# Patient Record
Sex: Male | Born: 1945
Health system: Southern US, Community
[De-identification: ages and names within clinical notes are randomized; demographics above are authoritative.]

## PROBLEM LIST (undated history)

## (undated) DIAGNOSIS — I493 Ventricular premature depolarization: Secondary | ICD-10-CM

## (undated) DIAGNOSIS — E785 Hyperlipidemia, unspecified: Secondary | ICD-10-CM

## (undated) DIAGNOSIS — D7589 Other specified diseases of blood and blood-forming organs: Secondary | ICD-10-CM

## (undated) DIAGNOSIS — Z8709 Personal history of other diseases of the respiratory system: Secondary | ICD-10-CM

## (undated) DIAGNOSIS — D696 Thrombocytopenia, unspecified: Secondary | ICD-10-CM

## (undated) DIAGNOSIS — N400 Enlarged prostate without lower urinary tract symptoms: Secondary | ICD-10-CM

## (undated) DIAGNOSIS — G43909 Migraine, unspecified, not intractable, without status migrainosus: Secondary | ICD-10-CM

## (undated) DIAGNOSIS — Z86010 Personal history of colonic polyps: Secondary | ICD-10-CM

## (undated) DIAGNOSIS — I10 Essential (primary) hypertension: Secondary | ICD-10-CM

## (undated) DIAGNOSIS — C189 Malignant neoplasm of colon, unspecified: Secondary | ICD-10-CM

## (undated) HISTORY — PX: TONSILECTOMY, ADENOIDECTOMY, BILATERAL MYRINGOTOMY AND TUBES: SHX2538

## (undated) HISTORY — DX: Thrombocytopenia, unspecified: D69.6

## (undated) HISTORY — DX: Essential (primary) hypertension: I10

## (undated) HISTORY — DX: Benign prostatic hyperplasia without lower urinary tract symptoms: N40.0

## (undated) HISTORY — PX: CATARACT EXTRACTION: SUR2

## (undated) HISTORY — DX: Malignant neoplasm of colon, unspecified: C18.9

## (undated) HISTORY — DX: Other specified diseases of blood and blood-forming organs: D75.89

## (undated) HISTORY — PX: NASAL SEPTUM SURGERY: SHX37

## (undated) HISTORY — DX: Hyperlipidemia, unspecified: E78.5

---

## 1999-09-13 ENCOUNTER — Ambulatory Visit (HOSPITAL_BASED_OUTPATIENT_CLINIC_OR_DEPARTMENT_OTHER): Admission: RE | Admit: 1999-09-13 | Discharge: 1999-09-13 | Payer: Self-pay | Admitting: *Deleted

## 2001-01-07 ENCOUNTER — Ambulatory Visit (HOSPITAL_BASED_OUTPATIENT_CLINIC_OR_DEPARTMENT_OTHER): Admission: RE | Admit: 2001-01-07 | Discharge: 2001-01-07 | Payer: Self-pay | Admitting: Surgery

## 2002-04-29 DIAGNOSIS — C189 Malignant neoplasm of colon, unspecified: Secondary | ICD-10-CM

## 2002-04-29 HISTORY — PX: COLECTOMY: SHX59

## 2002-04-29 HISTORY — DX: Malignant neoplasm of colon, unspecified: C18.9

## 2002-07-02 ENCOUNTER — Encounter (INDEPENDENT_AMBULATORY_CARE_PROVIDER_SITE_OTHER): Payer: Self-pay | Admitting: Specialist

## 2002-07-02 ENCOUNTER — Ambulatory Visit (HOSPITAL_COMMUNITY): Admission: RE | Admit: 2002-07-02 | Discharge: 2002-07-02 | Payer: Self-pay | Admitting: *Deleted

## 2002-07-12 ENCOUNTER — Encounter (INDEPENDENT_AMBULATORY_CARE_PROVIDER_SITE_OTHER): Payer: Self-pay | Admitting: *Deleted

## 2002-07-12 ENCOUNTER — Ambulatory Visit (HOSPITAL_COMMUNITY): Admission: RE | Admit: 2002-07-12 | Discharge: 2002-07-12 | Payer: Self-pay | Admitting: *Deleted

## 2002-07-27 ENCOUNTER — Encounter: Payer: Self-pay | Admitting: General Surgery

## 2002-07-27 ENCOUNTER — Encounter: Admission: RE | Admit: 2002-07-27 | Discharge: 2002-07-27 | Payer: Self-pay | Admitting: General Surgery

## 2002-08-11 ENCOUNTER — Encounter: Payer: Self-pay | Admitting: General Surgery

## 2002-08-20 ENCOUNTER — Inpatient Hospital Stay (HOSPITAL_COMMUNITY): Admission: RE | Admit: 2002-08-20 | Discharge: 2002-08-30 | Payer: Self-pay | Admitting: General Surgery

## 2002-08-20 ENCOUNTER — Encounter (INDEPENDENT_AMBULATORY_CARE_PROVIDER_SITE_OTHER): Payer: Self-pay | Admitting: Specialist

## 2002-08-26 ENCOUNTER — Encounter: Payer: Self-pay | Admitting: General Surgery

## 2003-08-01 ENCOUNTER — Ambulatory Visit (HOSPITAL_COMMUNITY): Admission: RE | Admit: 2003-08-01 | Discharge: 2003-08-01 | Payer: Self-pay | Admitting: *Deleted

## 2003-08-01 ENCOUNTER — Encounter (INDEPENDENT_AMBULATORY_CARE_PROVIDER_SITE_OTHER): Payer: Self-pay | Admitting: Specialist

## 2008-09-09 ENCOUNTER — Encounter: Admission: RE | Admit: 2008-09-09 | Discharge: 2008-09-09 | Payer: Self-pay | Admitting: Family Medicine

## 2009-04-29 HISTORY — PX: COLONOSCOPY: SHX174

## 2010-09-14 NOTE — Op Note (Signed)
[place]  Patient:    Jeffrey Wilkinson, Jeffrey Wilkinson                       MRN: [id number] Proc. Date: 09/13/99 Adm. Date:  [id number] Disc. Date: [id number] Attending:  Claudina Lick                           Operative Report  PREOPERATIVE DIAGNOSIS: 1. Deviated nasal septum. 2. Nasal turbinate hypertrophy.  POSTOPERATIVE DIAGNOSIS: 1. Deviated nasal septum. 2. Nasal turbinate hypertrophy.  OPERATION PERFORMED: 1. Nasal septoplasty. 2. Submucous resection of left inferior nasal turbinate.  SURGEON:  Robert L. Lyman Bishop, M.D.  ANESTHESIA:  General.  INDICATIONS FOR PROCEDURE:  The patient was admitted with a history of chronic nasal obstruction, also a history of recurring sinusitis of many years duration.  Examination showed a marked septal deviation to the right with compensatory enlargement of the left middle and inferior turbinates.  The patient was admitted for surgery.  DESCRIPTION OF PROCEDURE:  After satisfactory general endotracheal anesthesia had been induced, topical epinephrine packs were placed intranasally after which the nasal septum and the left inferior nasal turbinate were infiltrated with 1% Xylocaine containing 1:100,000 epinephrine for hemostasis, after which the nose and face were prepped with Betadine and sterile drapes were applied. Examination showed a marked displacement of the nasal septum especially anteriorly to the right with enlargement of the left inferior and to a lesser extent the left middle turbinate.  No polyps or exudate noted.  A left anterior septal incision was made just anterior to the mucocutaneous junction. The mucoperichondrium and periosteum elevated initially on the left side.  The cartilage was displaced off the maxillary crest to the right with considerable scarring in that area.  This was incised and the cartilage then partially separated from the bony septum and the mucoperiosteal flap on  the right side was then elevated.  The deviated portion of the perpendicular plate and vomer were removed with the Jansen-Middleton bone-biting forceps.  Where I previously incised the scarring at the inferior border of the cartilage, I was able to dissect down around the spur from the right side of the maxillary crest to the floor of the nose and the spur was taken down with a chisel and the Jansen-Middleton bone-biting forceps.  The bulk of the cartilage was preserved.  It could now be swung to the midline.  After making an incision in the left posterior septal flap for drainage purposes, the posterior septal flaps were reapproximated with a mattress suture of 4-0 plain gut and the cartilage was stabilized in the midline over the maxillary crest with three mattress sutures of 5-0 Vicryl placed in the [facility] technique after which removing some redundant tissue along the septal incision.  The incision was closed with running 5-0 chromic gut.  A small incision was made over the anterior end of the left inferior nasal turbinate, the mucoperiosteum elevated and excess turbinate bone removed, the incision closed with interrupted 5-0 chromic gut.  20 mg of Depo-Medrol was infiltrated in the anterior portion of each inferior nasal turbinate and each side of the nose was then packed with a folded strip of Telfa gauze coated with Cortisporin ointment.  ESTIMATED BLOOD LOSS:  20 to 25 cc.  The patient tolerated the procedure well, was awakened from anesthesia and taken to the recovery room in satisfactory condition. DD:  09/13/99 TD:  09/18/99 Job: [id number]  [id number]

## 2010-09-14 NOTE — Op Note (Signed)
Jeffrey Wilkinson, Jeffrey Wilkinson                          ACCOUNT NO.:  [id number]   MEDICAL RECORD NO.:  [id number]                   PATIENT TYPE:  INP   LOCATION:  U981                                 FACILITY:  [facility]   PHYSICIAN:  Adolph Pollack, M.D.            DATE OF BIRTH:  May 03, 1945   DATE OF PROCEDURE:  08/20/2002  DATE OF DISCHARGE:                                 OPERATIVE REPORT   PREOPERATIVE DIAGNOSIS:  Left colon cancer.   POSTOPERATIVE DIAGNOSES:  Sigmoid colon cancer.   PROCEDURE:  Partial colectomy, sigmoid and distal left colon.   SURGEON:  Adolph Pollack, M.D.   ASSISTANT:  Gita Kudo, M.D.   ANESTHESIA:  General.   INDICATIONS FOR PROCEDURE:  Jeffrey Wilkinson is a 65 year old male found to have  some colonic polyps and a flexible sigmoidoscopy. A colonoscopy was  performed and multiple polyps in the sigmoid colon and left colon area were  biopsied sizes ranging 4-8 mm. Once of the polyps came back adenocarcinoma  with  positive margin. The colonoscopy was repeated and the area that was  felt to be involved was tattooed and now he presents for elective partial  colectomy. The procedure and the risks were explained to him preoperatively.   TECHNIQUE:  He was seen in the holding area, brought to the operating room,  placed supine on the operating table and a general anesthetic was  administered. He was then placed in the lithotomy position. His abdominal  wall was shaved, Foley catheter was placed in the bladder and then the  abdominal wall was sterilely prepped and draped. The midline incision was  made incising the skin, subcutaneous tissue, midline fascia and peritoneum  and the abdominal cavity was explored. The liver and stomach were examined.  No lesions were noted. No_________  into the pelvis were noted. The small  bowel appeared normal.   The Bookwalter retractor was used for exposure. I exposed the sigmoid colon  and he had a lot of redundant  sigmoid colon measuring approximately 60 cm in  the proximal portion, this was a tattoo mark. I mobilized the sigmoid and  the left colon dividing the lateral peritoneal attachments. I then divided  the left colon at its mid point and then divided the sigmoid colon just  above the rectosigmoid junction. The mesentery was then divided and the  vessels ligated. The distal portion of the specimen was marked with a suture  and sent to pathology. The area was inspected. The ureter was identified and  was intact.   Next an end-to-end anastomosis was performed with interrupted 3-0 silk  sutures. The edges were inverted. The mesenteric defect was closed with  interrupted Vicryl sutures. The anastomosis was patent, viable, under no  tension. Air was insufflated through the rectum and the anastomosis was  noted to be air tight.   Next, the abdominal cavity was irrigated. The sponges were  removed and the  sponge count was reported to be correct. No bleeding was noted. The  Bookwalter retractor was removed, gloves and gowns were changed. After the  first needle, sponge and instrument count was reportedly correct, the  midline fascia was closed with a running #1 PDS suture. A second count was  also reported to be correct. The subcutaneous tissue was irrigated and the  skin was closed with staples.   He tolerated the procedure well without any apparent complications. He was  subsequently taken to the recovery room in satisfactory condition.                                               Adolph Pollack, M.D.    Kari Baars  D:  08/20/2002  T:  08/20/2002  Job:  [id number]   cc:   Althea Grimmer. Luther Parody, M.D.  [address]  [place]  [place] [address]  Fax: [phone]   Al Decant. Janey Greaser, M.D.  [address]  [place]  [place] [address]  Fax: [phone]

## 2010-09-14 NOTE — Discharge Summary (Signed)
Jeffrey Wilkinson, Jeffrey Wilkinson                          ACCOUNT NO.:  [id number]   MEDICAL RECORD NO.:  [id number]                   PATIENT TYPE:  INP   LOCATION:  4098                                 FACILITY:  [facility]   PHYSICIAN:  Adolph Pollack, M.D.            DATE OF BIRTH:  [birth year]   DATE OF ADMISSION:  08/20/2002  DATE OF DISCHARGE:  08/30/2002                                 DISCHARGE SUMMARY   DISCHARGE DIAGNOSES:  1. Sigmoid colon cancer.  2. Hypertension.  3. Postoperative ileus.   PROCEDURE:  Sigmoid partial colectomy, sigmoid colon and distal left colon  on August 20, 2002.   REASON FOR ADMISSION:  The patient is a 65 year old male who underwent a  colonoscopy by Dr. Roosvelt Harps.  He was found to have a number of polyps  that appeared to be somewhere between 40-70 cm, one of which was positive  for adenocarcinoma and had a positive margin.  Dr. Luther Parody performed  repeat endoscopy and put a tattoo mark where he felt the area was and he now  presents for his elective operation.   HOSPITAL COURSE:  He underwent the above procedure.  Of note,  intraoperatively he had a fairly elongated and tortuous sigmoid colon that  was initially resected as this is where the tattoo mark was.  This pathology  came back that there was no residual tumor and that lymph nodes were  negative.  Findings were consistent with the previous biopsy site.  He had  some problems with a prolonged postoperative ileus with intermittent nausea  and vomiting.  He did not want to have a NG tube put in.  We also had to  treat him for his hypertension with intravenous methods.  Once we were able  to get past the prolonged ileus, we were able to restart his  antihypertensives and slowly advance his diet.  He was started on some IV  Reglan and this seemed to help the ileus resolve a little quicker.  By  postop day #10, he is tolerating a full liquid diet, moving his bowels,  passing gas, ambulatory  with minimal pain and ready to be discharged.   DISPOSITION:  Discharged home in satisfactory condition on Aug 30, 2002.  Staples will be removed prior to discharge.   SPECIAL INSTRUCTIONS:  He is given an instruction sheet and activity  restrictions as well as dietary restrictions.    FOLLOW UP:  He will come back and see Dr. Abbey Chatters in two weeks and call  sooner with any problems.   DISCHARGE MEDICATIONS:  He was given a prescription for Tylox for pain.                                               Adolph Pollack, M.D.  TJR/MEDQ  D:  08/30/2002  T:  08/30/2002  Job:  [id number]   cc:   Althea Grimmer. Luther Parody, M.D.  [address]  [place]  [place] [address]  Fax: [phone]   Al Decant. Janey Greaser, M.D.  [address]  [place]  [place] [address]  Fax: [phone]

## 2012-05-08 ENCOUNTER — Telehealth: Payer: Self-pay | Admitting: Oncology

## 2012-05-08 NOTE — Telephone Encounter (Signed)
C/D 05/08/12 for appt.05/15/12

## 2012-05-11 ENCOUNTER — Encounter: Payer: Self-pay | Admitting: Oncology

## 2012-05-11 DIAGNOSIS — D7589 Other specified diseases of blood and blood-forming organs: Secondary | ICD-10-CM | POA: Insufficient documentation

## 2012-05-11 DIAGNOSIS — D696 Thrombocytopenia, unspecified: Secondary | ICD-10-CM | POA: Insufficient documentation

## 2012-05-13 ENCOUNTER — Telehealth: Payer: Self-pay | Admitting: Oncology

## 2012-05-13 NOTE — Telephone Encounter (Signed)
LVOM in re r/s NP appt.

## 2012-05-13 NOTE — Telephone Encounter (Signed)
This patient had called this am- asking for a return phone call. In the notes- I saw Tiffany's note- so I let her know that he requested a call back.   He is very angry because he talked with Elmarie Shiley and cancelled his Friday appt yet received an automated phone message reminding him of his Friday appt.  I explained that our service calls 48 hours in advance so that information was in the system before it was cancelled.   I offered to direct him to the Director of Medical Oncology but he declined.  He then stated that Tiffany was to call him at one when he told her- and I explained that Elmarie Shiley tries to follow up as requested but might be busy with other patients.   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   I apologized many times and let him know that I would contact Tiffany so that he can be rescheduled.

## 2012-05-15 ENCOUNTER — Other Ambulatory Visit: Payer: Self-pay | Admitting: Lab

## 2012-05-15 ENCOUNTER — Ambulatory Visit: Payer: Self-pay | Admitting: Oncology

## 2012-05-15 ENCOUNTER — Ambulatory Visit: Payer: Self-pay

## 2012-05-21 NOTE — Progress Notes (Signed)
Rescheduled

## 2012-05-26 ENCOUNTER — Other Ambulatory Visit: Payer: Self-pay | Admitting: Oncology

## 2012-05-27 ENCOUNTER — Other Ambulatory Visit: Payer: Self-pay | Admitting: Lab

## 2012-05-27 ENCOUNTER — Ambulatory Visit: Payer: Self-pay

## 2012-05-27 ENCOUNTER — Ambulatory Visit: Payer: Self-pay | Admitting: Oncology

## 2012-05-27 ENCOUNTER — Telehealth: Payer: Self-pay | Admitting: Oncology

## 2012-05-27 NOTE — Telephone Encounter (Signed)
PT called to r/s NP appt due to weather. Pt will see Dr. Gaylyn Rong 02/10 @ 3 calendar mailed.  Referring Dr. Zachery Dauer Dx-Abn Blood CT

## 2012-06-07 NOTE — Patient Instructions (Addendum)
1.  Issue: thrombocytopenia (low platelet count). 2.  Potential causes:    *  In general:  Production issue (VitB12, thyroid, hepatitis, cirrhosis, medication, rare cases of bone marrow failure such as myelodysplastic syndrome MDS) vs. Destruction (mostly autoimmune).  *  In your case:  Most likely due to Vytorin and Niaspan.  3.  Work up:  Rule out as much as possible the etiologies from above.  In the future, if platelet count continues to decline or also with anemia and low white blood cell (leukopenia), then we may consider diagnostic bone marrow biopsy.  Normal platelet count is around 140; however, the risk of spontaneous bleeding is minimal until platelet count decreases to less than 30.  4.  Follow up:  Blood test in about 6 months.  Return visit in about 1 year.

## 2012-06-08 ENCOUNTER — Ambulatory Visit: Payer: BC Managed Care – PPO

## 2012-06-08 ENCOUNTER — Encounter: Payer: Self-pay | Admitting: Oncology

## 2012-06-08 ENCOUNTER — Ambulatory Visit (HOSPITAL_BASED_OUTPATIENT_CLINIC_OR_DEPARTMENT_OTHER): Payer: BC Managed Care – PPO | Admitting: Oncology

## 2012-06-08 ENCOUNTER — Other Ambulatory Visit (HOSPITAL_BASED_OUTPATIENT_CLINIC_OR_DEPARTMENT_OTHER): Payer: BC Managed Care – PPO | Admitting: Lab

## 2012-06-08 VITALS — BP 158/85 | HR 111 | Temp 98.2°F | Resp 20 | Ht 76.0 in | Wt 261.3 lb

## 2012-06-08 DIAGNOSIS — Z85038 Personal history of other malignant neoplasm of large intestine: Secondary | ICD-10-CM

## 2012-06-08 DIAGNOSIS — Z807 Family history of other malignant neoplasms of lymphoid, hematopoietic and related tissues: Secondary | ICD-10-CM

## 2012-06-08 DIAGNOSIS — D7589 Other specified diseases of blood and blood-forming organs: Secondary | ICD-10-CM

## 2012-06-08 DIAGNOSIS — D696 Thrombocytopenia, unspecified: Secondary | ICD-10-CM

## 2012-06-08 LAB — CBC WITH DIFFERENTIAL/PLATELET
BASO%: 0.7 % (ref 0.0–2.0)
EOS%: 1.3 % (ref 0.0–7.0)
HCT: 41.7 % (ref 38.4–49.9)
LYMPH%: 27.4 % (ref 14.0–49.0)
MCH: 35.9 pg — ABNORMAL HIGH (ref 27.2–33.4)
MCHC: 36.3 g/dL — ABNORMAL HIGH (ref 32.0–36.0)
MCV: 98.9 fL — ABNORMAL HIGH (ref 79.3–98.0)
MONO%: 8.2 % (ref 0.0–14.0)
NEUT%: 62.4 % (ref 39.0–75.0)
Platelets: 132 10*3/uL — ABNORMAL LOW (ref 140–400)

## 2012-06-08 LAB — CHCC SMEAR

## 2012-06-08 NOTE — Progress Notes (Signed)
[facility]  [phone] [phone]     INITIAL HEMATOLOGY CONSULTATION    Referral MD:  Dr. Dossie Der, M.D.  Reason for Referral: chronic thrombocytopenia.     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  He has hyperlipidemia and has been on statin and Niacin for many years.  He does not know how long he has had thrombocytopenia.  However, per record from Dr. Zachery Dauer, the oldest CBC provided dated back to 10/24/2009 with WBC 4.6; hgb 15.5; plt 120; MCV 101.  The most recent CBC was drawn on 04/17/2012 with WBC 4.3; Hgb 15.0; Plt 127; MCV 101.  Given persistent thrombocytopenia and macrocytosis, he was kindly referred to the [facility] for evaluation.  Mr. Pennisi presented to the clinic by himself today.  He reported feeling well.  He denied all problem especially bleeding issues.  Patient denies fever, anorexia, weight loss, fatigue, headache, visual changes, confusion, drenching night sweats, palpable lymph node swelling, mucositis, odynophagia, dysphagia, nausea vomiting, jaundice, chest pain, palpitation, shortness of breath, dyspnea on exertion, productive cough, gum bleeding, epistaxis, hematemesis, hemoptysis, abdominal pain, abdominal swelling, early satiety, melena, hematochezia, hematuria, skin rash, spontaneous bleeding, joint swelling, joint pain, heat or cold intolerance, bowel bladder incontinence, back pain, focal motor weakness, paresthesia, depression.      Past Medical History  Diagnosis Date  . HTN (hypertension)   . Hyperlipidemia   . BPH (benign prostatic hyperplasia)   . Thrombocytopenia   . Colon cancer 2004    stage I.   . Macrocytosis   :    Past Surgical History  Procedure Laterality Date  . Colonoscopy  2011    with [facility]; due for 2016.   . Colectomy  2004  . Tonsilectomy, adenoidectomy, bilateral myringotomy and tubes    :   CURRENT MEDS: Current Outpatient  Prescriptions  Medication Sig Dispense Refill  . amLODipine-benazepril (LOTREL) 5-20 MG per capsule Take 1 capsule by mouth daily.      Marland Kitchen aspirin 325 MG tablet Take 325 mg by mouth daily.      Marland Kitchen doxazosin (CARDURA) 4 MG tablet Take 4 mg by mouth daily.      . hydrochlorothiazide (HYDRODIURIL) 25 MG tablet Take 25 mg by mouth daily.      . Multiple Vitamins-Minerals (SENIOR MULTIVITAMIN PLUS PO) Take 1 tablet by mouth daily.      . niacin (NIASPAN) 1000 MG CR tablet Take 1,000 mg by mouth at bedtime.      . simvastatin (ZOCOR) 20 MG tablet Take 20 mg by mouth every evening.       No current facility-administered medications for this visit.      Allergies  Allergen Reactions  . Penicillins Hives  :  Family History  Problem Relation Age of Onset  . Aneurysm Mother   . Cancer Father 57    myeloma  . Aneurysm Maternal Aunt   . Aneurysm Maternal Uncle   :  History   Social History  . Marital Status: Married    Spouse Name: N/A    Number of Children: 0  . Years of Education: N/A   Occupational History  .  [employer]    Spanish professor   Social History Main Topics  . Smoking status: Former Smoker -- 1.00 packs/day for 30 years    Quit date: 04/29/1998  . Smokeless tobacco: Never Used  . Alcohol Use: 6.0 oz/week  10 Glasses of wine per week  . Drug Use: No  . Sexually Active: Not on file   Other Topics Concern  . Not on file   Social History Narrative   Male partner Smitty Cords of 28 years.  Bruce works in Doctor, general practice.   :  REVIEW OF SYSTEM:  The rest of the 14-point review of sytem was negative.   Exam: ECOG 0  General:  well-nourished man, in no acute distress.  Eyes:  no scleral icterus.  ENT:  There were no oropharyngeal lesions.  Neck was without thyromegaly.  Lymphatics:  Negative cervical, supraclavicular or axillary adenopathy.  Respiratory: lungs were clear bilaterally without wheezing or crackles.  Cardiovascular:  Regular rate and rhythm, S1/S2,  without murmur, rub or gallop.  There was no pedal edema.  GI:  abdomen was soft, flat, nontender, nondistended, without organomegaly.  Muscoloskeletal:  no spinal tenderness of palpation of vertebral spine.  Skin exam was without echymosis, petichae.  Neuro exam was nonfocal.  Patient was able to get on and off exam table without assistance.  Gait was normal.  Patient was alerted and oriented.  Attention was good.   Language was appropriate.  Mood was normal without depression.  Speech was not pressured.  Thought content was not tangential.    LABS:  Lab Results  Component Value Date   WBC 6.1 06/08/2012   HGB 15.2 06/08/2012   HCT 41.7 06/08/2012   PLT 132* 06/08/2012     Blood smear review:   I personally reviewed the patient's peripheral blood smear today.  There was isocytosis.  There was no peripheral blast.  There was no schistocytosis, spherocytosis, target cell, rouleaux formation, tear drop cell.  There was no giant platelets or platelet clumps.      ASSESSMENT AND PLAN:   1.  Hypertension:  On Lotrel and HCTZ. 2.  Hyperlipidemia:  On Zocor and Niacin. 3.  BPH:  On doxazosin. 4.  History of colon cancer in 2004.  He follows up with Dr. Bosie Clos with [facility].  5.  Chronic thrombocytopenia since at least 2011; possibly longer; stable with macrocytosis.   - Potential causes:    *  In general:  Production issue (VitB12, thyroid, hepatitis, cirrhosis, medication, rare cases of bone marrow failure such as myelodysplastic syndrome MDS) vs. Destruction (mostly autoimmune).  *  In his case:  Most likely due to Vytorin and Niaspan.  - Work up: I sent for TSH and Vit B12 which were both normal.  I also sent for SPEP and serum light chains to rule out myeloma given family history and macrocytosis.  I sent for HIV as well.   -  Prognosis:  Normal platelet count is around 140; however, the risk of spontaneous bleeding is minimal until platelet count decreases to less than 30.  As his  thrombocytopenia has been quite stable, if these above tests are negative, no further testing is strongly indicated.  Mr. Hearne expressed informed understanding and wished to proceed with watchful observation.   - Follow up:  Repeat CBC at the Cancer Center in about 6 months.  Return visit in about 1 year.    The length of time of the face-to-face encounter was 45 minutes. More than 50% of time was spent counseling and coordination of care.     Thank you for this referral.

## 2012-06-08 NOTE — Progress Notes (Signed)
Checked in new patient. No financial issues. °

## 2012-06-09 ENCOUNTER — Other Ambulatory Visit: Payer: Self-pay | Admitting: Oncology

## 2012-06-09 ENCOUNTER — Telehealth: Payer: Self-pay | Admitting: Oncology

## 2012-06-09 DIAGNOSIS — D696 Thrombocytopenia, unspecified: Secondary | ICD-10-CM

## 2012-06-09 LAB — HIV ANTIBODY (ROUTINE TESTING W REFLEX): HIV: NONREACTIVE

## 2012-06-09 NOTE — Telephone Encounter (Signed)
lvm for pt regarding to Aug and Feb 2015 appt

## 2012-06-10 LAB — PROTEIN ELECTROPHORESIS, SERUM
Albumin ELP: 60.8 % (ref 55.8–66.1)
Alpha-1-Globulin: 4.6 % (ref 2.9–4.9)
Beta 2: 5 % (ref 3.2–6.5)
Beta Globulin: 6.1 % (ref 4.7–7.2)
Total Protein, Serum Electrophoresis: 7.1 g/dL (ref 6.0–8.3)

## 2012-06-10 LAB — KAPPA/LAMBDA LIGHT CHAINS
Kappa:Lambda Ratio: 0.37 (ref 0.26–1.65)
Lambda Free Lght Chn: 2.04 mg/dL (ref 0.57–2.63)

## 2012-06-13 ENCOUNTER — Other Ambulatory Visit: Payer: Self-pay

## 2012-09-01 ENCOUNTER — Telehealth: Payer: Self-pay | Admitting: Oncology

## 2012-09-14 ENCOUNTER — Emergency Department (HOSPITAL_COMMUNITY): Payer: BC Managed Care – PPO

## 2012-09-14 ENCOUNTER — Encounter (HOSPITAL_COMMUNITY): Payer: Self-pay | Admitting: *Deleted

## 2012-09-14 ENCOUNTER — Emergency Department (HOSPITAL_COMMUNITY)
Admission: EM | Admit: 2012-09-14 | Discharge: 2012-09-14 | Disposition: A | Discharge status: Home / Self Care | Payer: BC Managed Care – PPO | Attending: Emergency Medicine | Admitting: Emergency Medicine

## 2012-09-14 DIAGNOSIS — R42 Dizziness and giddiness: Secondary | ICD-10-CM

## 2012-09-14 DIAGNOSIS — N400 Enlarged prostate without lower urinary tract symptoms: Secondary | ICD-10-CM | POA: Insufficient documentation

## 2012-09-14 DIAGNOSIS — Z7982 Long term (current) use of aspirin: Secondary | ICD-10-CM | POA: Insufficient documentation

## 2012-09-14 DIAGNOSIS — I10 Essential (primary) hypertension: Secondary | ICD-10-CM | POA: Insufficient documentation

## 2012-09-14 DIAGNOSIS — E785 Hyperlipidemia, unspecified: Secondary | ICD-10-CM | POA: Insufficient documentation

## 2012-09-14 DIAGNOSIS — Z880 Allergy status to penicillin: Secondary | ICD-10-CM | POA: Insufficient documentation

## 2012-09-14 DIAGNOSIS — Z79899 Other long term (current) drug therapy: Secondary | ICD-10-CM | POA: Insufficient documentation

## 2012-09-14 DIAGNOSIS — Z862 Personal history of diseases of the blood and blood-forming organs and certain disorders involving the immune mechanism: Secondary | ICD-10-CM | POA: Insufficient documentation

## 2012-09-14 DIAGNOSIS — Z85038 Personal history of other malignant neoplasm of large intestine: Secondary | ICD-10-CM | POA: Insufficient documentation

## 2012-09-14 DIAGNOSIS — Z87891 Personal history of nicotine dependence: Secondary | ICD-10-CM | POA: Insufficient documentation

## 2012-09-14 LAB — COMPREHENSIVE METABOLIC PANEL
ALT: 20 U/L (ref 0–53)
AST: 29 U/L (ref 0–37)
Albumin: 3.6 g/dL (ref 3.5–5.2)
Alkaline Phosphatase: 63 U/L (ref 39–117)
CO2: 20 mEq/L (ref 19–32)
Chloride: 99 mEq/L (ref 96–112)
Creatinine, Ser: 0.85 mg/dL (ref 0.50–1.35)
GFR calc non Af Amer: >90 mL/min (ref >90)
Potassium: 4 mEq/L (ref 3.5–5.1)
Sodium: 135 mEq/L (ref 135–145)
Total Bilirubin: 0.4 mg/dL (ref 0.3–1.2)

## 2012-09-14 LAB — CBC WITH DIFFERENTIAL/PLATELET
Basophils Absolute: 0 10*3/uL (ref 0.0–0.1)
HCT: 40.5 % (ref 39.0–52.0)
Hemoglobin: 15.2 g/dL (ref 13.0–17.0)
Lymphocytes Relative: 10 % — ABNORMAL LOW (ref 12–46)
Monocytes Absolute: 0.6 10*3/uL (ref 0.1–1.0)
Neutro Abs: 6 10*3/uL (ref 1.7–7.7)
Neutrophils Relative %: 82 % — ABNORMAL HIGH (ref 43–77)
RDW: 12.8 % (ref 11.5–15.5)
WBC: 7.4 10*3/uL (ref 4.0–10.5)

## 2012-09-14 MED ORDER — SODIUM CHLORIDE 0.9 % IV BOLUS (SEPSIS)
1000.0000 mL | Freq: Once | INTRAVENOUS | Status: AC
  Administered 2012-09-14: 1000 mL via INTRAVENOUS

## 2012-09-14 NOTE — ED Provider Notes (Signed)
History     CSN: [id number]  Arrival date & time 09/14/12  1252   First MD Initiated Contact with Patient 09/14/12 1301      Chief Complaint  Patient presents with  . Dizziness    (Consider location/radiation/quality/duration/timing/severity/associated sxs/prior treatment) HPI  Pt is a 67 yo M PMHx significant for HTN, hyperlipidemia presenting to ED after episode of lightheadedness while working out his trainer at the gym today, but did not fall or LOC. Pt states he was in the middle of his workout when he felt as though he might pass out was able to get off the treadmill sit down and felt better with time. Pt states he went to his PCP office for evaluation and he was noted to be sympatomic orthostatic and was sent over to the ED from office. Pt states he was not drinking water during his workout and just recently started going to the gym again for weight loss. He denies any CP, SOB, diaphoresis, abdominal pain, slurred speech, weakness, neurofocal deficit, palpitations when he became lightheaded or even afterwards. Patient denies fevers, chills, nausea, vomiting, or diarrhea.     Past Medical History  Diagnosis Date  . HTN (hypertension)   . Hyperlipidemia   . BPH (benign prostatic hyperplasia)   . Thrombocytopenia   . Colon cancer 2004    stage I.   . Macrocytosis     Past Surgical History  Procedure Laterality Date  . Colonoscopy  2011    with [facility]; due for 2016.   . Colectomy  2004  . Tonsilectomy, adenoidectomy, bilateral myringotomy and tubes      Family History  Problem Relation Age of Onset  . Aneurysm Mother   . Cancer Father 51    myeloma  . Aneurysm Maternal Aunt   . Aneurysm Maternal Uncle     History  Substance Use Topics  . Smoking status: Former Smoker -- 1.00 packs/day for 30 years    Quit date: 04/29/1998  . Smokeless tobacco: Never Used  . Alcohol Use: 6.0 oz/week    10 Glasses of wine per week      Review of Systems  Constitutional:  Negative for fever and chills.  Respiratory: Negative for chest tightness and shortness of breath.   Cardiovascular: Negative for chest pain, palpitations and leg swelling.  Gastrointestinal: Negative for abdominal pain.  Neurological: Positive for light-headedness.  All other systems reviewed and are negative.    Allergies  Penicillins  Home Medications   Current Outpatient Rx  Name  Route  Sig  Dispense  Refill  . amLODipine-benazepril (LOTREL) 5-20 MG per capsule   Oral   Take 1 capsule by mouth daily.         Marland Kitchen aspirin 325 MG tablet   Oral   Take 325 mg by mouth daily.         . clobetasol cream (TEMOVATE) 0.05 %   Topical   Apply 1 application topically 2 (two) times daily. Apply to feet twice daily         . doxazosin (CARDURA) 4 MG tablet   Oral   Take 4 mg by mouth daily.         . hydrochlorothiazide (HYDRODIURIL) 25 MG tablet   Oral   Take 25 mg by mouth daily.         . Multiple Vitamins-Minerals (SENIOR MULTIVITAMIN PLUS PO)   Oral   Take 1 tablet by mouth daily.         Marland Kitchen  niacin (NIASPAN) 1000 MG CR tablet   Oral   Take 1,000 mg by mouth at bedtime.         . simvastatin (ZOCOR) 20 MG tablet   Oral   Take 20 mg by mouth every evening.           BP 145/82  Pulse 96  Temp(Src) 98.1 F (36.7 C) (Oral)  Resp 22  SpO2 97%  Physical Exam  Constitutional: He is oriented to person, place, and time. He appears well-developed and well-nourished. No distress.  HENT:  Head: Normocephalic and atraumatic.  Eyes: EOM are normal. Pupils are equal, round, and reactive to light.  Cardiovascular: Normal rate, regular rhythm, normal heart sounds and intact distal pulses.   Pulmonary/Chest: Effort normal and breath sounds normal. No accessory muscle usage. No respiratory distress.  Abdominal: Soft. Bowel sounds are normal.  Neurological: He is alert and oriented to person, place, and time. He has normal strength. No cranial nerve deficit or  sensory deficit. He displays a negative Romberg sign. Coordination and gait normal.  Negative heel knee to shin Negative finger nose finger No pronator drift  Skin: Skin is warm and dry. He is not diaphoretic.  Chronic flushing from Niacin use, no change from baseline per patient and friend.   Psychiatric: He has a normal mood and affect.    ED Course  Procedures (including critical care time)   Date: 09/14/2012  Rate: 95  Rhythm: sinus rhythm with occassional PVC  QRS Axis: normal  Intervals: normal  ST/T Wave abnormalities: nonspecific T wave changes  Conduction Disutrbances:none  Narrative Interpretation:   Old EKG Reviewed: none available    Labs Reviewed  CBC WITH DIFFERENTIAL - Abnormal; Notable for the following:    MCH 35.3 (*)    MCHC 37.5 (*)    Platelets 134 (*)    Neutrophils Relative % 82 (*)    Lymphocytes Relative 10 (*)    All other components within normal limits  COMPREHENSIVE METABOLIC PANEL - Abnormal; Notable for the following:    Glucose, Bld 110 (*)    All other components within normal limits   Dg Chest Port 1 View  09/14/2012   *RADIOLOGY REPORT*  Clinical Data: Dizziness  PORTABLE CHEST - 1 VIEW  Comparison: None.  Findings: The heart is at the upper limits of normal in size.  The lungs are clear bilaterally.  No bony abnormality is seen.  IMPRESSION: No acute abnormality noted.   Original Report Authenticated By: Alcide Clever, M.D.     1. Lightheadedness       MDM  Pt presented w/ one episode of lightheadedness that occurred during exercising on the treadmill. Pt denied any LOC, CP, SOB, palpitations, HA with lightheadedness. Pt was sent from PCP to ED for further evaluation. Orthostatic vitals, but asymptomatic. No neurofocal deficits on examination. Able to ambulate. Troponin, CXR, EKG all reviewed. VSS. Pt advised to follow up with PCP. Patient agreeable to plan. Return precautions given. Patient d/w with Dr. Rhunette Croft, agrees with plan.  Patient is stable at time of discharge    Gilmer Mor 09/14/12 1709  Shared service with midlevel provider. I have personally seen and examined the patient, providing direct face to face care, presenting with the chief complaint of dizziness. Physical exam findings include non focal neuro and CV exam. Pt had near syncope like sx while at gym today. He is noted to be orthostatic. Plan will be to get basic labs- he is likely  discharge. I have reviewed the nursing documentation on past medical history, family history, and social history.   Derwood Kaplan, MD 09/16/12 1040

## 2012-09-14 NOTE — ED Notes (Signed)
Pt reports recently started working out 3 weeks ago 5-6 times/week. Does 2 miles on treadmill. Reports does not drink any fluids during workout & may drink a glass of water on the way home from gym. One of pt's home meds is a diuretic

## 2012-09-14 NOTE — ED Notes (Signed)
Per EMS pt from [facility] Physician with c/o dizziness. Had episode on Friday after walking on treadmill, today while working out pt had another episode. No syncope. Pt diaphoretic and orthostatic per doctor's office. CBG 119. BP 117/81. Dropped to 80's per MD office. 20G L FA.

## 2012-11-19 ENCOUNTER — Other Ambulatory Visit (HOSPITAL_BASED_OUTPATIENT_CLINIC_OR_DEPARTMENT_OTHER): Payer: BC Managed Care – PPO

## 2012-11-19 DIAGNOSIS — D7589 Other specified diseases of blood and blood-forming organs: Secondary | ICD-10-CM

## 2012-11-19 DIAGNOSIS — D696 Thrombocytopenia, unspecified: Secondary | ICD-10-CM

## 2012-11-19 DIAGNOSIS — Z807 Family history of other malignant neoplasms of lymphoid, hematopoietic and related tissues: Secondary | ICD-10-CM

## 2012-11-19 LAB — CBC WITH DIFFERENTIAL/PLATELET
BASO%: 0.7 % (ref 0.0–2.0)
Basophils Absolute: 0 10*3/uL (ref 0.0–0.1)
EOS%: 2.6 % (ref 0.0–7.0)
HCT: 43.4 % (ref 38.4–49.9)
HGB: 15.5 g/dL (ref 13.0–17.1)
LYMPH%: 45.2 % (ref 14.0–49.0)
MCH: 35.8 pg — ABNORMAL HIGH (ref 27.2–33.4)
MCHC: 35.7 g/dL (ref 32.0–36.0)
MCV: 100.3 fL — ABNORMAL HIGH (ref 79.3–98.0)
NEUT%: 44.5 % (ref 39.0–75.0)
Platelets: 132 10*3/uL — ABNORMAL LOW (ref 140–400)

## 2012-11-20 ENCOUNTER — Telehealth: Payer: Self-pay | Admitting: *Deleted

## 2012-11-20 NOTE — Telephone Encounter (Signed)
Message copied by Wende Mott on Fri Nov 20, 2012  9:11 AM ------      Message from: Clenton Pare R      Created: Thu Nov 19, 2012 10:09 PM       Please call pt. Plt remains stable. Continue observation. ------

## 2012-11-20 NOTE — Telephone Encounter (Signed)
Informed pt of lab results stable and to keep appt as scheduled in 6 months.  Instructed to call sooner for any unusual bleeding or bruising. Pt verbalized understanding.

## 2012-12-07 ENCOUNTER — Other Ambulatory Visit: Payer: BC Managed Care – PPO

## 2013-03-04 ENCOUNTER — Other Ambulatory Visit: Payer: Self-pay

## 2013-06-07 ENCOUNTER — Other Ambulatory Visit: Payer: Self-pay | Admitting: Hematology and Oncology

## 2013-06-07 DIAGNOSIS — D7589 Other specified diseases of blood and blood-forming organs: Secondary | ICD-10-CM

## 2013-06-07 DIAGNOSIS — D696 Thrombocytopenia, unspecified: Secondary | ICD-10-CM

## 2013-06-08 ENCOUNTER — Ambulatory Visit (HOSPITAL_BASED_OUTPATIENT_CLINIC_OR_DEPARTMENT_OTHER): Payer: Medicare Other | Admitting: Hematology and Oncology

## 2013-06-08 ENCOUNTER — Other Ambulatory Visit (HOSPITAL_BASED_OUTPATIENT_CLINIC_OR_DEPARTMENT_OTHER): Payer: BC Managed Care – PPO

## 2013-06-08 ENCOUNTER — Encounter: Payer: Self-pay | Admitting: Hematology and Oncology

## 2013-06-08 VITALS — BP 150/96 | HR 118 | Temp 96.9°F | Resp 18 | Ht 76.0 in | Wt 269.6 lb

## 2013-06-08 DIAGNOSIS — D696 Thrombocytopenia, unspecified: Secondary | ICD-10-CM

## 2013-06-08 DIAGNOSIS — D7589 Other specified diseases of blood and blood-forming organs: Secondary | ICD-10-CM

## 2013-06-08 LAB — CBC & DIFF AND RETIC
BASO%: 0.7 % (ref 0.0–2.0)
Basophils Absolute: 0 10*3/uL (ref 0.0–0.1)
EOS%: 1.1 % (ref 0.0–7.0)
Eosinophils Absolute: 0.1 10*3/uL (ref 0.0–0.5)
HEMATOCRIT: 44.1 % (ref 38.4–49.9)
HEMOGLOBIN: 15.5 g/dL (ref 13.0–17.1)
IMMATURE RETIC FRACT: 7 % (ref 3.00–10.60)
LYMPH#: 2.6 10*3/uL (ref 0.9–3.3)
LYMPH%: 39.9 % (ref 14.0–49.0)
MCH: 35.5 pg — ABNORMAL HIGH (ref 27.2–33.4)
MCHC: 35.1 g/dL (ref 32.0–36.0)
MCV: 101 fL — ABNORMAL HIGH (ref 79.3–98.0)
MONO#: 0.5 10*3/uL (ref 0.1–0.9)
MONO%: 7.6 % (ref 0.0–14.0)
NEUT#: 3.3 10*3/uL (ref 1.5–6.5)
NEUT%: 50.7 % (ref 39.0–75.0)
Platelets: 122 10*3/uL — ABNORMAL LOW (ref 140–400)
RBC: 4.36 10*6/uL (ref 4.20–5.82)
RDW: 13.1 % (ref 11.0–14.6)
RETIC CT ABS: 88.94 10*3/uL (ref 34.80–93.90)
Retic %: 2.04 % — ABNORMAL HIGH (ref 0.80–1.80)
WBC: 6.5 10*3/uL (ref 4.0–10.3)
nRBC: 0 % (ref 0–0)

## 2013-06-08 LAB — COMPREHENSIVE METABOLIC PANEL (CC13)
ALT: 27 U/L (ref 0–55)
ANION GAP: 13 meq/L — AB (ref 3–11)
AST: 29 U/L (ref 5–34)
Albumin: 4.4 g/dL (ref 3.5–5.0)
Alkaline Phosphatase: 62 U/L (ref 40–150)
BILIRUBIN TOTAL: 0.95 mg/dL (ref 0.20–1.20)
BUN: 10.1 mg/dL (ref 7.0–26.0)
CO2: 24 meq/L (ref 22–29)
CREATININE: 0.8 mg/dL (ref 0.7–1.3)
Calcium: 10.1 mg/dL (ref 8.4–10.4)
Chloride: 100 mEq/L (ref 98–109)
GLUCOSE: 101 mg/dL (ref 70–140)
Potassium: 3.5 mEq/L (ref 3.5–5.1)
Sodium: 137 mEq/L (ref 136–145)
Total Protein: 7.4 g/dL (ref 6.4–8.3)

## 2013-06-08 LAB — MORPHOLOGY: PLT EST: DECREASED

## 2013-06-08 LAB — LACTATE DEHYDROGENASE (CC13): LDH: 185 U/L (ref 125–245)

## 2013-06-08 NOTE — Progress Notes (Signed)
[facility] OFFICE PROGRESS NOTE  Jeffrey Heck, MD DIAGNOSIS:  Mild thrombocytopenia and mild macrocytosis  SUMMARY OF HEMATOLOGIC HISTORY: This is a pleasant 68 year old gentleman who was found to have mild thrombocytopenia ranging from 120 234. He also had mildly elevated MCV and is currently being observed. INTERVAL HISTORY: Jeffrey Wilkinson 68 y.o. male returns for further followup. The patient denies any recent signs or symptoms of bleeding such as spontaneous epistaxis, hematuria or hematochezia. The patient admits to drinking 4-5 alcoholic drinks per week. He is morbidly obese and has gained 8 pounds of weight over the past one year  I have reviewed the past medical history, past surgical history, social history and family history with the patient and they are unchanged from previous note.  ALLERGIES:  is allergic to penicillins.  MEDICATIONS:  Current Outpatient Prescriptions  Medication Sig Dispense Refill  . amLODipine-benazepril (LOTREL) 5-20 MG per capsule Take 1 capsule by mouth daily.      Marland Kitchen aspirin 325 MG tablet Take 325 mg by mouth daily.      . clobetasol cream (TEMOVATE) 0.99 % Apply 1 application topically 2 (two) times daily. Apply to feet twice daily      . doxazosin (CARDURA) 4 MG tablet Take 4 mg by mouth daily.      . hydrochlorothiazide (HYDRODIURIL) 25 MG tablet Take 25 mg by mouth daily.      . Multiple Vitamins-Minerals (SENIOR MULTIVITAMIN PLUS PO) Take 1 tablet by mouth daily.      . niacin (NIASPAN) 1000 MG CR tablet Take 1,000 mg by mouth at bedtime.      . simvastatin (ZOCOR) 20 MG tablet Take 20 mg by mouth every evening.       No current facility-administered medications for this visit.     REVIEW OF SYSTEMS:   Constitutional: Denies fevers, chills or night sweats Eyes: Denies blurriness of vision Ears, nose, mouth, throat, and face: Denies mucositis or sore throat Respiratory: Denies cough, dyspnea or  wheezes Cardiovascular: Denies palpitation, chest discomfort or lower extremity swelling Gastrointestinal:  Denies nausea, heartburn or change in bowel habits Skin: Denies abnormal skin rashes Lymphatics: Denies new lymphadenopathy or easy bruising Neurological:Denies numbness, tingling or new weaknesses Behavioral/Psych: Mood is stable, no new changes  All other systems were reviewed with the patient and are negative.  PHYSICAL EXAMINATION: ECOG PERFORMANCE STATUS: 0 - Asymptomatic  Filed Vitals:   06/08/13 1504  BP: 150/96  Pulse: 118  Temp: 96.9 F (36.1 C)  Resp: 18   Filed Weights   06/08/13 1504  Weight: 269 lb 9.6 oz (122.29 kg)    GENERAL:alert, no distress and comfortable. He is morbidly obese SKIN: skin color, texture, turgor are normal, no rashes or significant lesions EYES: normal, Conjunctiva are pink and non-injected, sclera clear OROPHARYNX:no exudate, no erythema and lips, buccal mucosa, and tongue normal  NECK: supple, thyroid normal size, non-tender, without nodularity LYMPH:  no palpable lymphadenopathy in the cervical, axillary or inguinal LUNGS: clear to auscultation and percussion with normal breathing effort HEART: regular rate & rhythm and no murmurs and no lower extremity edema ABDOMEN:abdomen soft, non-tender and normal bowel sounds. Unable to appreciate hepatosplenomegaly due to significant central obesity Musculoskeletal:no cyanosis of digits and no clubbing  NEURO: alert & oriented x 3 with fluent speech, no focal motor/sensory deficits  LABORATORY DATA:  I have reviewed the data as listed Results for orders placed in visit on 06/08/13 (from the past 48 hour(s))  LACTATE DEHYDROGENASE (CC13)  Status: None   Collection Time    06/08/13  2:52 PM      Result Value Range   LDH 185  125 - 245 U/L  CBC & DIFF AND RETIC     Status: Abnormal   Collection Time    06/08/13  2:52 PM      Result Value Range   WBC 6.5  4.0 - 10.3 10e3/uL   NEUT#  3.3  1.5 - 6.5 10e3/uL   HGB 15.5  13.0 - 17.1 g/dL   HCT 44.1  38.4 - 49.9 %   Platelets 122 (*) 140 - 400 10e3/uL   MCV 101.0 (*) 79.3 - 98.0 fL   MCH 35.5 (*) 27.2 - 33.4 pg   MCHC 35.1  32.0 - 36.0 g/dL   RBC 4.36  4.20 - 5.82 10e6/uL   RDW 13.1  11.0 - 14.6 %   lymph# 2.6  0.9 - 3.3 10e3/uL   MONO# 0.5  0.1 - 0.9 10e3/uL   Eosinophils Absolute 0.1  0.0 - 0.5 10e3/uL   Basophils Absolute 0.0  0.0 - 0.1 10e3/uL   NEUT% 50.7  39.0 - 75.0 %   LYMPH% 39.9  14.0 - 49.0 %   MONO% 7.6  0.0 - 14.0 %   EOS% 1.1  0.0 - 7.0 %   BASO% 0.7  0.0 - 2.0 %   nRBC 0  0 - 0 %   Retic % 2.04 (*) 0.80 - 1.80 %   Retic Ct Abs 88.94  34.80 - 93.90 10e3/uL   Immature Retic Fract 7.00  3.00 - 10.60 %  MORPHOLOGY     Status: None   Collection Time    06/08/13  2:52 PM      Result Value Range   Target Cells Few  Negative   White Cell Comments Occ Variant Lymphs     PLT EST Decreased  Adequate  COMPREHENSIVE METABOLIC PANEL (ZO10)     Status: Abnormal   Collection Time    06/08/13  2:52 PM      Result Value Range   Sodium 137  136 - 145 mEq/L   Potassium 3.5  3.5 - 5.1 mEq/L   Chloride 100  98 - 109 mEq/L   CO2 24  22 - 29 mEq/L   Glucose 101  70 - 140 mg/dl   BUN 10.1  7.0 - 26.0 mg/dL   Creatinine 0.8  0.7 - 1.3 mg/dL   Total Bilirubin 0.95  0.20 - 1.20 mg/dL   Alkaline Phosphatase 62  40 - 150 U/L   AST 29  5 - 34 U/L   ALT 27  0 - 55 U/L   Total Protein 7.4  6.4 - 8.3 g/dL   Albumin 4.4  3.5 - 5.0 g/dL   Calcium 10.1  8.4 - 10.4 mg/dL   Anion Gap 13 (*) 3 - 11 mEq/L    Lab Results  Component Value Date   WBC 6.5 06/08/2013   HGB 15.5 06/08/2013   HCT 44.1 06/08/2013   MCV 101.0* 06/08/2013   PLT 122* 06/08/2013   I reviewed his peripheral smear. There were no evidence of platelet clumping. The white blood cell and red blood cell morphology were normal ASSESSMENT & PLAN:  #1 mild macrocytosis #2 Mild thrombocytopenia #3 morbid obesity #4 history of hyperlipidemia #5 suspect  alcoholism I recommend the patient to try weight loss and exercise. I suspect the abnormal CBC is related to fatty liver disease. The patient is  not symptomatic. I will see him on a yearly basis. I also recommend him to reduce his alcohol intake. All questions were answered. The patient knows to call the clinic with any problems, questions or concerns. No barriers to learning was detected.  I spent 15 minutes counseling the patient face to face. The total time spent in the appointment was 20 minutes and more than 50% was on counseling.     [facility], [place], MD 06/08/2013 3:56 PM

## 2013-06-09 ENCOUNTER — Telehealth: Payer: Self-pay | Admitting: *Deleted

## 2013-06-09 NOTE — Telephone Encounter (Signed)
Lm gv appts for 06/09/14 w/ labs@ 2:15p and ov@ 2:45pm. Made the pt aware that i will mail a letter/avs...td

## 2014-05-09 ENCOUNTER — Telehealth: Payer: Self-pay | Admitting: Hematology and Oncology

## 2014-05-09 NOTE — Telephone Encounter (Signed)
s.w. pt and advised on 2.11 appt moved to 2.18 due to MD on pal..Marland KitchenMarland KitchenMarland Kitchenpt ok and aware

## 2014-06-09 ENCOUNTER — Other Ambulatory Visit: Payer: Medicare Other

## 2014-06-09 ENCOUNTER — Ambulatory Visit: Payer: Medicare Other | Admitting: Hematology and Oncology

## 2014-06-15 ENCOUNTER — Other Ambulatory Visit: Payer: Self-pay | Admitting: Hematology and Oncology

## 2014-06-15 DIAGNOSIS — D696 Thrombocytopenia, unspecified: Secondary | ICD-10-CM

## 2014-06-16 ENCOUNTER — Ambulatory Visit (HOSPITAL_BASED_OUTPATIENT_CLINIC_OR_DEPARTMENT_OTHER): Payer: Medicare Other | Admitting: Hematology and Oncology

## 2014-06-16 ENCOUNTER — Other Ambulatory Visit (HOSPITAL_BASED_OUTPATIENT_CLINIC_OR_DEPARTMENT_OTHER): Payer: Medicare Other

## 2014-06-16 VITALS — BP 143/70 | HR 106 | Temp 98.1°F | Resp 18 | Ht 76.0 in | Wt 266.7 lb

## 2014-06-16 DIAGNOSIS — D7589 Other specified diseases of blood and blood-forming organs: Secondary | ICD-10-CM

## 2014-06-16 DIAGNOSIS — D696 Thrombocytopenia, unspecified: Secondary | ICD-10-CM

## 2014-06-16 LAB — CBC WITH DIFFERENTIAL/PLATELET
BASO%: 0.5 % (ref 0.0–2.0)
Basophils Absolute: 0 10*3/uL (ref 0.0–0.1)
EOS%: 2.5 % (ref 0.0–7.0)
Eosinophils Absolute: 0.2 10*3/uL (ref 0.0–0.5)
HCT: 43.5 % (ref 38.4–49.9)
HGB: 14.9 g/dL (ref 13.0–17.1)
LYMPH#: 2.7 10*3/uL (ref 0.9–3.3)
LYMPH%: 38.5 % (ref 14.0–49.0)
MCH: 34.5 pg — AB (ref 27.2–33.4)
MCHC: 34.3 g/dL (ref 32.0–36.0)
MCV: 100.5 fL — ABNORMAL HIGH (ref 79.3–98.0)
MONO#: 0.6 10*3/uL (ref 0.1–0.9)
MONO%: 8.2 % (ref 0.0–14.0)
NEUT#: 3.5 10*3/uL (ref 1.5–6.5)
NEUT%: 50.3 % (ref 39.0–75.0)
Platelets: 136 10*3/uL — ABNORMAL LOW (ref 140–400)
RBC: 4.33 10*6/uL (ref 4.20–5.82)
RDW: 12.8 % (ref 11.0–14.6)
WBC: 6.9 10*3/uL (ref 4.0–10.3)

## 2014-06-16 NOTE — Assessment & Plan Note (Signed)
This is due to alcoholism. I recommend him to stop drinking.

## 2014-06-16 NOTE — Progress Notes (Signed)
[place] NOTE  [name], MD SUMMARY OF HEMATOLOGIC HISTORY:  This is a pleasant 69 year old gentleman who was found to have mild thrombocytopenia ranging from 120 to 134. He also had mildly elevated MCV and is currently being observed. INTERVAL HISTORY: Jeffrey Wilkinson 69 y.o. male returns for for further follow-up. He continues to drink alcohol. The patient denies any recent signs or symptoms of bleeding such as spontaneous epistaxis, hematuria or hematochezia.  I have reviewed the past medical history, past surgical history, social history and family history with the patient and they are unchanged from previous note.  ALLERGIES:  is allergic to penicillins.  MEDICATIONS:  Current Outpatient Prescriptions  Medication Sig Dispense Refill  . amLODipine-benazepril (LOTREL) 5-20 MG per capsule Take 1 capsule by mouth daily.    Marland Kitchen aspirin 325 MG tablet Take 325 mg by mouth daily.    . clobetasol cream (TEMOVATE) 5.32 % Apply 1 application topically 2 (two) times daily. Apply to feet twice daily    . doxazosin (CARDURA) 4 MG tablet Take 4 mg by mouth daily.    . hydrochlorothiazide (HYDRODIURIL) 25 MG tablet Take 25 mg by mouth daily.    . Multiple Vitamins-Minerals (SENIOR MULTIVITAMIN PLUS PO) Take 1 tablet by mouth daily.    . niacin (NIASPAN) 1000 MG CR tablet Take 1,000 mg by mouth at bedtime.    . simvastatin (ZOCOR) 20 MG tablet Take 20 mg by mouth every evening.     No current facility-administered medications for this visit.     REVIEW OF SYSTEMS:   Constitutional: Denies fevers, chills or night sweats Eyes: Denies blurriness of vision Ears, nose, mouth, throat, and face: Denies mucositis or sore throat Respiratory: Denies cough, dyspnea or wheezes Cardiovascular: Denies palpitation, chest discomfort or lower extremity swelling Gastrointestinal:  Denies nausea, heartburn or change in bowel habits Skin: Denies abnormal skin  rashes Lymphatics: Denies new lymphadenopathy or easy bruising Neurological:Denies numbness, tingling or new weaknesses Behavioral/Psych: Mood is stable, no new changes  All other systems were reviewed with the patient and are negative.  PHYSICAL EXAMINATION: ECOG PERFORMANCE STATUS: 0 - Asymptomatic  Filed Vitals:   06/16/14 1449  BP: 143/70  Pulse: 106  Temp: 98.1 F (36.7 C)  Resp: 18   Filed Weights   06/16/14 1449  Weight: 266 lb 11.2 oz (120.974 kg)    GENERAL:alert, no distress and comfortable. He is morbidly obese SKIN: skin color, texture, turgor are normal, no rashes or significant lesions. Rosacea is seen on his face EYES: normal, Conjunctiva are pink and non-injected, sclera clear OROPHARYNX:no exudate, no erythema and lips, buccal mucosa, and tongue normal  NEURO: alert & oriented x 3 with fluent speech, no focal motor/sensory deficits  LABORATORY DATA:  I have reviewed the data as listed Results for orders placed or performed in visit on 06/16/14 (from the past 48 hour(s))  CBC with Differential/Platelet     Status: Abnormal   Collection Time: 06/16/14  2:26 PM  Result Value Ref Range   WBC 6.9 4.0 - 10.3 10e3/uL   NEUT# 3.5 1.5 - 6.5 10e3/uL   HGB 14.9 13.0 - 17.1 g/dL   HCT 43.5 38.4 - 49.9 %   Platelets 136 (L) 140 - 400 10e3/uL   MCV 100.5 (H) 79.3 - 98.0 fL   MCH 34.5 (H) 27.2 - 33.4 pg   MCHC 34.3 32.0 - 36.0 g/dL   RBC 4.33 4.20 - 5.82 10e6/uL   RDW 12.8 11.0 -  14.6 %   lymph# 2.7 0.9 - 3.3 10e3/uL   MONO# 0.6 0.1 - 0.9 10e3/uL   Eosinophils Absolute 0.2 0.0 - 0.5 10e3/uL   Basophils Absolute 0.0 0.0 - 0.1 10e3/uL   NEUT% 50.3 39.0 - 75.0 %   LYMPH% 38.5 14.0 - 49.0 %   MONO% 8.2 0.0 - 14.0 %   EOS% 2.5 0.0 - 7.0 %   BASO% 0.5 0.0 - 2.0 %    Lab Results  Component Value Date   WBC 6.9 06/16/2014   HGB 14.9 06/16/2014   HCT 43.5 06/16/2014   MCV 100.5* 06/16/2014   PLT 136* 06/16/2014   ASSESSMENT & PLAN:  Thrombocytopenia This is  likely due to possible fatty liver disease and alcoholism. It is stable. I recommend discharged from this clinic and monitor with PCP only.   Macrocytosis This is due to alcoholism. I recommend him to stop drinking.    All questions were answered. The patient knows to call the clinic with any problems, questions or concerns. No barriers to learning was detected.  I spent 15 minutes counseling the patient face to face. The total time spent in the appointment was 20 minutes and more than 50% was on counseling.     [facility], Cynthie Garmon, MD 2/18/20163:14 PM

## 2014-06-16 NOTE — Assessment & Plan Note (Signed)
This is likely due to possible fatty liver disease and alcoholism. It is stable. I recommend discharged from this clinic and monitor with PCP only.

## 2014-11-10 ENCOUNTER — Other Ambulatory Visit: Payer: Self-pay | Admitting: Gastroenterology

## 2017-12-01 ENCOUNTER — Other Ambulatory Visit: Payer: Self-pay | Admitting: Dermatology

## 2018-01-08 ENCOUNTER — Other Ambulatory Visit: Payer: Self-pay | Admitting: Dermatology

## 2018-01-12 ENCOUNTER — Encounter (HOSPITAL_COMMUNITY): Payer: Self-pay

## 2018-01-12 NOTE — Patient Instructions (Addendum)
Your procedure is scheduled on: Friday, Sept. 20, 2019   Surgery Time:  7:30AM-8:30AM   Report to [facility] Main  Entrance    Report to admitting at 5:30 AM   Call this number if you have problems the morning of surgery [phone]   Do not eat food or drink liquids :After Midnight.   Brush your teeth the morning of surgery.   Do NOT smoke after Midnight   Take these medicines the morning of surgery with A SIP OF WATER: Amlodipine, Doxazosin                               You may not have any metal on your body including jewelry, and body piercings             Do not wear lotions, powders, perfumes/cologne, or deodorant                         Men may shave face and neck.   Do not bring valuables to the hospital. [place].   Contacts, dentures or bridgework may not be worn into surgery.    Patients discharged the day of surgery will not be allowed to drive home.   Special Instructions: Bring a copy of your healthcare power of attorney and living will documents         the day of surgery if you haven't scanned them in before.              Please read over the following fact sheets you were given:  [facility] - Preparing for Surgery Before surgery, you can play an important role.  Because skin is not sterile, your skin needs to be as free of germs as possible.  You can reduce the number of germs on your skin by washing with CHG (chlorahexidine gluconate) soap before surgery.  CHG is an antiseptic cleaner which kills germs and bonds with the skin to continue killing germs even after washing. Please DO NOT use if you have an allergy to CHG or antibacterial soaps.  If your skin becomes reddened/irritated stop using the CHG and inform your nurse when you arrive at Short Stay. Do not shave (including legs and underarms) for at least 48 hours prior to the first CHG shower.  You may shave your face/neck.  Please follow  these instructions carefully:  1.  Shower with CHG Soap the night before surgery and the  morning of surgery.  2.  If you choose to wash your hair, wash your hair first as usual with your normal  shampoo.  3.  After you shampoo, rinse your hair and body thoroughly to remove the shampoo.                             4.  Use CHG as you would any other liquid soap.  You can apply chg directly to the skin and wash.  Gently with a scrungie or clean washcloth.  5.  Apply the CHG Soap to your body ONLY FROM THE NECK DOWN.   Do   not use on face/ open  Wound or open sores. Avoid contact with eyes, ears mouth and   genitals (private parts).                       Wash face,  Genitals (private parts) with your normal soap.             6.  Wash thoroughly, paying special attention to the area where your    surgery  will be performed.  7.  Thoroughly rinse your body with warm water from the neck down.  8.  DO NOT shower/wash with your normal soap after using and rinsing off the CHG Soap.                9.  Pat yourself dry with a clean towel.            10.  Wear clean pajamas.            11.  Place clean sheets on your bed the night of your first shower and do not  sleep with pets. Day of Surgery : Do not apply any lotions/deodorants the morning of surgery.  Please wear clean clothes to the hospital/surgery center.  FAILURE TO FOLLOW THESE INSTRUCTIONS MAY RESULT IN THE CANCELLATION OF YOUR SURGERY  PATIENT SIGNATURE_________________________________  NURSE SIGNATURE__________________________________  ________________________________________________________________________

## 2018-01-13 ENCOUNTER — Encounter (HOSPITAL_COMMUNITY)
Admission: RE | Admit: 2018-01-13 | Discharge: 2018-01-13 | Disposition: A | Discharge status: Home / Self Care | Payer: Medicare Other | Source: Ambulatory Visit | Attending: Surgery | Admitting: Surgery

## 2018-01-13 ENCOUNTER — Encounter (HOSPITAL_COMMUNITY): Payer: Self-pay

## 2018-01-13 ENCOUNTER — Other Ambulatory Visit: Payer: Self-pay

## 2018-01-13 DIAGNOSIS — Z880 Allergy status to penicillin: Secondary | ICD-10-CM | POA: Diagnosis not present

## 2018-01-13 DIAGNOSIS — Z01818 Encounter for other preprocedural examination: Secondary | ICD-10-CM | POA: Insufficient documentation

## 2018-01-13 DIAGNOSIS — I10 Essential (primary) hypertension: Secondary | ICD-10-CM | POA: Diagnosis not present

## 2018-01-13 DIAGNOSIS — R222 Localized swelling, mass and lump, trunk: Secondary | ICD-10-CM

## 2018-01-13 DIAGNOSIS — C768 Malignant neoplasm of other specified ill-defined sites: Secondary | ICD-10-CM | POA: Diagnosis not present

## 2018-01-13 DIAGNOSIS — I493 Ventricular premature depolarization: Secondary | ICD-10-CM | POA: Diagnosis not present

## 2018-01-13 DIAGNOSIS — Z87891 Personal history of nicotine dependence: Secondary | ICD-10-CM | POA: Diagnosis not present

## 2018-01-13 DIAGNOSIS — N400 Enlarged prostate without lower urinary tract symptoms: Secondary | ICD-10-CM | POA: Diagnosis not present

## 2018-01-13 DIAGNOSIS — D696 Thrombocytopenia, unspecified: Secondary | ICD-10-CM | POA: Diagnosis not present

## 2018-01-13 DIAGNOSIS — E785 Hyperlipidemia, unspecified: Secondary | ICD-10-CM | POA: Diagnosis not present

## 2018-01-13 DIAGNOSIS — Z79899 Other long term (current) drug therapy: Secondary | ICD-10-CM | POA: Diagnosis not present

## 2018-01-13 DIAGNOSIS — Z7982 Long term (current) use of aspirin: Secondary | ICD-10-CM | POA: Diagnosis not present

## 2018-01-13 DIAGNOSIS — Z6834 Body mass index (BMI) 34.0-34.9, adult: Secondary | ICD-10-CM | POA: Diagnosis not present

## 2018-01-13 DIAGNOSIS — Z85038 Personal history of other malignant neoplasm of large intestine: Secondary | ICD-10-CM | POA: Diagnosis not present

## 2018-01-13 DIAGNOSIS — Z86010 Personal history of colonic polyps: Secondary | ICD-10-CM | POA: Diagnosis not present

## 2018-01-13 DIAGNOSIS — L728 Other follicular cysts of the skin and subcutaneous tissue: Secondary | ICD-10-CM | POA: Diagnosis present

## 2018-01-13 HISTORY — DX: Personal history of colonic polyps: Z86.010

## 2018-01-13 HISTORY — DX: Morbid (severe) obesity due to excess calories: E66.01

## 2018-01-13 HISTORY — DX: Personal history of other diseases of the respiratory system: Z87.09

## 2018-01-13 HISTORY — DX: Migraine, unspecified, not intractable, without status migrainosus: G43.909

## 2018-01-13 HISTORY — DX: Ventricular premature depolarization: I49.3

## 2018-01-13 LAB — BASIC METABOLIC PANEL
ANION GAP: 11 (ref 5–15)
BUN: 8 mg/dL (ref 8–23)
CHLORIDE: 103 mmol/L (ref 98–111)
CO2: 25 mmol/L (ref 22–32)
Calcium: 9.8 mg/dL (ref 8.9–10.3)
Creatinine, Ser: 0.64 mg/dL (ref 0.61–1.24)
GFR calc Af Amer: >60 mL/min (ref >60)
GLUCOSE: 102 mg/dL — AB (ref 70–99)
Potassium: 3.8 mmol/L (ref 3.5–5.1)
Sodium: 139 mmol/L (ref 135–145)

## 2018-01-13 LAB — CBC
HEMATOCRIT: 42.8 % (ref 39.0–52.0)
HEMOGLOBIN: 15.5 g/dL (ref 13.0–17.0)
MCH: 34.1 pg — ABNORMAL HIGH (ref 26.0–34.0)
MCHC: 36.2 g/dL — ABNORMAL HIGH (ref 30.0–36.0)
MCV: 94.3 fL (ref 78.0–100.0)
Platelets: 153 10*3/uL (ref 150–400)
RBC: 4.54 MIL/uL (ref 4.22–5.81)
RDW: 12.5 % (ref 11.5–15.5)
WBC: 6.3 10*3/uL (ref 4.0–10.5)

## 2018-01-13 NOTE — Pre-Procedure Instructions (Signed)
CBC and BMP results 01/13/2018 faxed to Dr. Hassell Done via epic.

## 2018-01-15 NOTE — Anesthesia Preprocedure Evaluation (Addendum)
Anesthesia Evaluation  Patient identified by MRN, date of birth, ID band Patient awake    Reviewed: Allergy & Precautions, NPO status , Patient's Chart, lab work & pertinent test results  History of Anesthesia Complications Negative for: history of anesthetic complications  Airway Mallampati: II  TM Distance: >3 FB Neck ROM: Full    Dental no notable dental hx. (+) Dental Advisory Given   Pulmonary neg pulmonary ROS, former smoker,    Pulmonary exam normal        Cardiovascular hypertension, Pt. on medications Normal cardiovascular exam     Neuro/Psych  Headaches,    GI/Hepatic negative GI ROS, Neg liver ROS,   Endo/Other  negative endocrine ROS  Renal/GU negative Renal ROS     Musculoskeletal negative musculoskeletal ROS (+)   Abdominal   Peds  Hematology negative hematology ROS (+)   Anesthesia Other Findings Day of surgery medications reviewed with the patient.  Reproductive/Obstetrics                            Anesthesia Physical Anesthesia Plan  ASA: II  Anesthesia Plan: General   Post-op Pain Management:    Induction: Intravenous  PONV Risk Score and Plan: 2 and Ondansetron and Dexamethasone  Airway Management Planned: Oral ETT  Additional Equipment:   Intra-op Plan:   Post-operative Plan: Extubation in OR  Informed Consent: I have reviewed the patients History and Physical, chart, labs and discussed the procedure including the risks, benefits and alternatives for the proposed anesthesia with the patient or authorized representative who has indicated his/her understanding and acceptance.   Dental advisory given  Plan Discussed with: CRNA and Anesthesiologist  Anesthesia Plan Comments:        Anesthesia Quick Evaluation

## 2018-01-16 ENCOUNTER — Ambulatory Visit (HOSPITAL_COMMUNITY): Payer: Medicare Other | Admitting: Anesthesiology

## 2018-01-16 ENCOUNTER — Encounter (HOSPITAL_COMMUNITY)
Admission: RE | Disposition: A | Discharge status: Home / Self Care | Payer: Self-pay | Source: Other Acute Inpatient Hospital | Attending: Surgery

## 2018-01-16 ENCOUNTER — Encounter (HOSPITAL_COMMUNITY): Payer: Self-pay | Admitting: *Deleted

## 2018-01-16 ENCOUNTER — Ambulatory Visit (HOSPITAL_COMMUNITY)
Admission: RE | Admit: 2018-01-16 | Discharge: 2018-01-16 | Disposition: A | Discharge status: Home / Self Care | Payer: Medicare Other | Source: Other Acute Inpatient Hospital | Attending: Surgery | Admitting: Surgery

## 2018-01-16 DIAGNOSIS — I10 Essential (primary) hypertension: Secondary | ICD-10-CM | POA: Insufficient documentation

## 2018-01-16 DIAGNOSIS — Z6834 Body mass index (BMI) 34.0-34.9, adult: Secondary | ICD-10-CM | POA: Insufficient documentation

## 2018-01-16 DIAGNOSIS — Z79899 Other long term (current) drug therapy: Secondary | ICD-10-CM | POA: Insufficient documentation

## 2018-01-16 DIAGNOSIS — E785 Hyperlipidemia, unspecified: Secondary | ICD-10-CM | POA: Diagnosis not present

## 2018-01-16 DIAGNOSIS — D696 Thrombocytopenia, unspecified: Secondary | ICD-10-CM | POA: Insufficient documentation

## 2018-01-16 DIAGNOSIS — C768 Malignant neoplasm of other specified ill-defined sites: Secondary | ICD-10-CM | POA: Diagnosis not present

## 2018-01-16 DIAGNOSIS — Z86010 Personal history of colonic polyps: Secondary | ICD-10-CM | POA: Insufficient documentation

## 2018-01-16 DIAGNOSIS — Z7982 Long term (current) use of aspirin: Secondary | ICD-10-CM | POA: Insufficient documentation

## 2018-01-16 DIAGNOSIS — Z85038 Personal history of other malignant neoplasm of large intestine: Secondary | ICD-10-CM | POA: Insufficient documentation

## 2018-01-16 DIAGNOSIS — Z880 Allergy status to penicillin: Secondary | ICD-10-CM | POA: Insufficient documentation

## 2018-01-16 DIAGNOSIS — N400 Enlarged prostate without lower urinary tract symptoms: Secondary | ICD-10-CM | POA: Insufficient documentation

## 2018-01-16 DIAGNOSIS — I493 Ventricular premature depolarization: Secondary | ICD-10-CM | POA: Insufficient documentation

## 2018-01-16 DIAGNOSIS — Z87891 Personal history of nicotine dependence: Secondary | ICD-10-CM | POA: Insufficient documentation

## 2018-01-16 DIAGNOSIS — R222 Localized swelling, mass and lump, trunk: Secondary | ICD-10-CM

## 2018-01-16 HISTORY — PX: MASS EXCISION: SHX2000

## 2018-01-16 SURGERY — EXCISION MASS
Anesthesia: General | Site: Back

## 2018-01-16 MED ORDER — LACTATED RINGERS IV SOLN
INTRAVENOUS | Status: DC
  Administered 2018-01-16 (×2): via INTRAVENOUS

## 2018-01-16 MED ORDER — ONDANSETRON HCL 4 MG/2ML IJ SOLN
INTRAMUSCULAR | Status: DC | PRN
  Administered 2018-01-16: 4 mg via INTRAVENOUS

## 2018-01-16 MED ORDER — ISOPROPYL ALCOHOL 70 % SOLN
Status: DC | PRN
  Administered 2018-01-16: 1 via TOPICAL

## 2018-01-16 MED ORDER — LIDOCAINE HCL (PF) 1 % IJ SOLN
INTRAMUSCULAR | Status: AC
  Filled 2018-01-16: qty 30

## 2018-01-16 MED ORDER — LIDOCAINE 2% (20 MG/ML) 5 ML SYRINGE
INTRAMUSCULAR | Status: AC
  Filled 2018-01-16: qty 5

## 2018-01-16 MED ORDER — CEFAZOLIN SODIUM-DEXTROSE 2-4 GM/100ML-% IV SOLN
2.0000 g | INTRAVENOUS | Status: AC
  Administered 2018-01-16: 2 g via INTRAVENOUS
  Filled 2018-01-16: qty 100

## 2018-01-16 MED ORDER — HYDROMORPHONE HCL 1 MG/ML IJ SOLN: 0.2500 mg | INTRAMUSCULAR | Status: DC | PRN

## 2018-01-16 MED ORDER — HYDROCODONE-ACETAMINOPHEN 5-325 MG PO TABS: 1.0000 | ORAL_TABLET | Freq: Four times a day (QID) | ORAL | 0 refills | Status: AC | PRN

## 2018-01-16 MED ORDER — ROCURONIUM BROMIDE 10 MG/ML (PF) SYRINGE
PREFILLED_SYRINGE | INTRAVENOUS | Status: AC
  Filled 2018-01-16: qty 10

## 2018-01-16 MED ORDER — FENTANYL CITRATE (PF) 100 MCG/2ML IJ SOLN
INTRAMUSCULAR | Status: AC
  Filled 2018-01-16: qty 2

## 2018-01-16 MED ORDER — PROPOFOL 10 MG/ML IV BOLUS
INTRAVENOUS | Status: AC
  Filled 2018-01-16: qty 20

## 2018-01-16 MED ORDER — MIDAZOLAM HCL 2 MG/2ML IJ SOLN
INTRAMUSCULAR | Status: AC
  Filled 2018-01-16: qty 2

## 2018-01-16 MED ORDER — CHLORHEXIDINE GLUCONATE CLOTH 2 % EX PADS: 6.0000 | MEDICATED_PAD | Freq: Once | CUTANEOUS | Status: DC

## 2018-01-16 MED ORDER — 0.9 % SODIUM CHLORIDE (POUR BTL) OPTIME
TOPICAL | Status: DC | PRN
  Administered 2018-01-16: 1000 mL

## 2018-01-16 MED ORDER — PROMETHAZINE HCL 25 MG/ML IJ SOLN: 6.2500 mg | INTRAMUSCULAR | Status: DC | PRN

## 2018-01-16 MED ORDER — ISOPROPYL ALCOHOL 70 % SOLN
Status: AC
  Filled 2018-01-16: qty 480

## 2018-01-16 MED ORDER — ACETAMINOPHEN 500 MG PO TABS
1000.0000 mg | ORAL_TABLET | ORAL | Status: AC
  Administered 2018-01-16: 1000 mg via ORAL
  Filled 2018-01-16: qty 2

## 2018-01-16 MED ORDER — FENTANYL CITRATE (PF) 100 MCG/2ML IJ SOLN
INTRAMUSCULAR | Status: DC | PRN
  Administered 2018-01-16: 50 ug via INTRAVENOUS
  Administered 2018-01-16: 25 ug via INTRAVENOUS
  Administered 2018-01-16 (×2): 50 ug via INTRAVENOUS
  Administered 2018-01-16: 25 ug via INTRAVENOUS

## 2018-01-16 MED ORDER — SUGAMMADEX SODIUM 500 MG/5ML IV SOLN
INTRAVENOUS | Status: AC
  Filled 2018-01-16: qty 5

## 2018-01-16 MED ORDER — PROPOFOL 10 MG/ML IV BOLUS
INTRAVENOUS | Status: DC | PRN
  Administered 2018-01-16: 200 mg via INTRAVENOUS

## 2018-01-16 MED ORDER — GABAPENTIN 300 MG PO CAPS
300.0000 mg | ORAL_CAPSULE | ORAL | Status: AC
  Administered 2018-01-16: 300 mg via ORAL
  Filled 2018-01-16: qty 1

## 2018-01-16 MED ORDER — SUGAMMADEX SODIUM 500 MG/5ML IV SOLN
INTRAVENOUS | Status: DC | PRN
  Administered 2018-01-16: 250 mg via INTRAVENOUS

## 2018-01-16 MED ORDER — LIDOCAINE 2% (20 MG/ML) 5 ML SYRINGE
INTRAMUSCULAR | Status: DC | PRN
  Administered 2018-01-16: 100 mg via INTRAVENOUS

## 2018-01-16 MED ORDER — ROCURONIUM BROMIDE 10 MG/ML (PF) SYRINGE
PREFILLED_SYRINGE | INTRAVENOUS | Status: DC | PRN
  Administered 2018-01-16: 60 mg via INTRAVENOUS

## 2018-01-16 MED ORDER — MIDAZOLAM HCL 5 MG/5ML IJ SOLN
INTRAMUSCULAR | Status: DC | PRN
  Administered 2018-01-16 (×2): 1 mg via INTRAVENOUS

## 2018-01-16 MED ORDER — DEXAMETHASONE SODIUM PHOSPHATE 10 MG/ML IJ SOLN
INTRAMUSCULAR | Status: DC | PRN
  Administered 2018-01-16: 10 mg via INTRAVENOUS

## 2018-01-16 MED ORDER — HEPARIN SODIUM (PORCINE) 5000 UNIT/ML IJ SOLN
5000.0000 [IU] | Freq: Once | INTRAMUSCULAR | Status: AC
  Administered 2018-01-16: 5000 [IU] via SUBCUTANEOUS
  Filled 2018-01-16: qty 1

## 2018-01-16 SURGICAL SUPPLY — 28 items
BENZOIN TINCTURE PRP APPL 2/3 (GAUZE/BANDAGES/DRESSINGS) IMPLANT
BINDER BREAST LRG (GAUZE/BANDAGES/DRESSINGS) IMPLANT
BINDER BREAST XLRG (GAUZE/BANDAGES/DRESSINGS) IMPLANT
BLADE HEX COATED 2.75 (ELECTRODE) ×3 IMPLANT
BLADE SURG 15 STRL LF DISP TIS (BLADE) ×1 IMPLANT
BLADE SURG 15 STRL SS (BLADE) ×2
CANISTER PREVENA PLUS 150 (CANNISTER) IMPLANT
DECANTER SPIKE VIAL GLASS SM (MISCELLANEOUS) IMPLANT
DRAPE LAPAROTOMY T 102X78X121 (DRAPES) IMPLANT
DRSG VAC ATS SM SENSATRAC (GAUZE/BANDAGES/DRESSINGS) ×3 IMPLANT
ELECT PENCIL ROCKER SW 15FT (MISCELLANEOUS) ×3 IMPLANT
ELECT REM PT RETURN 15FT ADLT (MISCELLANEOUS) ×3 IMPLANT
GAUZE PACKING IODOFORM 1/4X15 (GAUZE/BANDAGES/DRESSINGS) IMPLANT
GAUZE SPONGE 4X4 12PLY STRL (GAUZE/BANDAGES/DRESSINGS) ×3 IMPLANT
GLOVE BIOGEL M 8.0 STRL (GLOVE) ×3 IMPLANT
GOWN STRL REUS W/TWL XL LVL3 (GOWN DISPOSABLE) ×6 IMPLANT
KIT BASIN OR (CUSTOM PROCEDURE TRAY) ×3 IMPLANT
MARKER SKIN DUAL TIP RULER LAB (MISCELLANEOUS) ×6 IMPLANT
NEEDLE HYPO 22GX1.5 SAFETY (NEEDLE) ×3 IMPLANT
PACK BASIC VI WITH GOWN DISP (CUSTOM PROCEDURE TRAY) ×3 IMPLANT
PAD TELFA 2X3 NADH STRL (GAUZE/BANDAGES/DRESSINGS) IMPLANT
SPONGE LAP 4X18 RFD (DISPOSABLE) ×3 IMPLANT
STAPLER VISISTAT 35W (STAPLE) IMPLANT
SUT SILK 3 0 SH 30 (SUTURE) ×3 IMPLANT
SUT VIC AB 4-0 SH 18 (SUTURE) IMPLANT
SYR 20CC LL (SYRINGE) ×3 IMPLANT
WND VAC CANISTER 500ML (MISCELLANEOUS) ×3 IMPLANT
YANKAUER SUCT BULB TIP 10FT TU (MISCELLANEOUS) ×3 IMPLANT

## 2018-01-16 NOTE — H&P (Signed)
Chief Complaint:  Mass on the left back  History of Present Illness:  Jeffrey Wilkinson is an 72 y.o. male who has a partially excisised mass of his left back from Dr. Denna Haggard.  He comes for excision.    Past Medical History:  Diagnosis Date  . BPH (benign prostatic hyperplasia)   . Colon cancer ([place]) 2004   stage I.   . History of colon polyps   . History of deviated nasal septum   . HTN (hypertension)   . Hyperlipidemia   . Macrocytosis   . Migraines    history of  . Morbid obesity ([name])   . PVC (premature ventricular contraction)   . Thrombocytopenia ([place])     Past Surgical History:  Procedure Laterality Date  . CATARACT EXTRACTION Right   . CATARACT EXTRACTION Left   . COLECTOMY  2004  . COLONOSCOPY  2011   with [facility]; due for 2016.   Marland Kitchen NASAL SEPTUM SURGERY    . TONSILECTOMY, ADENOIDECTOMY, BILATERAL MYRINGOTOMY AND TUBES      Current Facility-Administered Medications  Medication Dose Route Frequency Provider Last Rate Last Dose  . ceFAZolin (ANCEF) IVPB 2g/100 mL premix  2 g Intravenous On Call to OR Johnathan Hausen, MD      . Chlorhexidine Gluconate Cloth 2 % PADS 6 each  6 each Topical Once Johnathan Hausen, MD       And  . Chlorhexidine Gluconate Cloth 2 % PADS 6 each  6 each Topical Once Johnathan Hausen, MD      . lactated ringers infusion   Intravenous Continuous Montez Hageman, MD 75 mL/hr at 01/16/18 0620     Penicillins Family History  Problem Relation Age of Onset  . Aneurysm Mother   . Cancer Father 38       myeloma  . Aneurysm Maternal Aunt   . Aneurysm Maternal Uncle    Social History:   reports that he quit smoking about 19 years ago. He has a 30.00 pack-year smoking history. He has never used smokeless tobacco. He reports that he drinks about 10.0 standard drinks of alcohol per week. He reports that he does not use drugs.   REVIEW OF SYSTEMS : Negative except for see problem list  Physical Exam:   Blood pressure (!) 144/82, pulse 75, temperature  97.9 F (36.6 C), temperature source Oral, resp. rate (!) 22, height 6\' 2"  (1.88 m), weight 120.2 kg, SpO2 99 %. Body mass index is 34.02 kg/m.  Gen:  WDWN WM NAD  Neurological: Alert and oriented to person, place, and time. Motor and sensory function is grossly intact  Head: Normocephalic and atraumatic.  Eyes: Conjunctivae are normal. Pupils are equal, round, and reactive to light. No scleral icterus.  Neck: Normal range of motion. Neck supple. No tracheal deviation or thyromegaly present.  Cardiovascular:  SR without murmurs or gallops.  No carotid bruits Breast:  Not examined Respiratory: Effort normal.  No respiratory distress. No chest wall tenderness. Breath sounds normal.  No wheezes, rales or rhonchi.  Abdomen:  nontender GU:  Not examined.   Musculoskeletal: Normal range of motion. Extremities are nontender. No cyanosis, edema or clubbing noted Lymphadenopathy: No cervical, preauricular, postauricular or axillary adenopathy is present Skin: Skin is warm and dry. No rash noted. No diaphoresis. No erythema. No pallor. Pscyh: Normal mood and affect. Behavior is normal. Judgment and thought content normal.   LABORATORY RESULTS: No results found for this or any previous visit (from the past 48 hour(s)).  RADIOLOGY RESULTS: No results found.  Problem List: Patient Active Problem List   Diagnosis Date Noted  . Thrombocytopenia ([place])   . Macrocytosis     Assessment & Plan: Partially excised mass of the left back    Matt B. Hassell Done, MD, [facility] [phone] beeper [phone]  01/16/2018 7:13 AM

## 2018-01-16 NOTE — Progress Notes (Signed)
PACU Phase II  Richarda Blade., RN from [facility] at bedside with wound vac equipment for pt to go home, instruction given by Santiago Glad. Pt is ready for discharge.

## 2018-01-16 NOTE — Discharge Instructions (Signed)
`Negative Pressure Wound Therapy Dressing Care Negative pressure wound therapy (NPWT) is a device that helps wounds heal. NPWT helps the wound stay clean and healthy while it heals from the inside. NPWT uses a bandage (dressing) that is made of a sponge or gauze-like material. The dressing is placed in or inside the wound. The wound is then covered and sealed with a cover dressing that sticks to your skin (adhesive). This keeps air out. A tube connects the cover dressing to a small pump. The pump sucks fluid and germs from the wound. The pump also controls any odor coming from the wound. What are the risks? NPWT is usually safe to use. The most common problem is skin irritation from the dressing adhesive, but there are many ways to prevent this from happening. However, more serious problems can develop, such as:  Bleeding.  Infection.  Dehydration.  Pain.  How to change your dressing How often you change your dressing depends on your wound. If the pump is off for more than two hours, the dressing will need to be changed. Follow your health care providers instructions on how often to change it. Your health care provider may change your dressing, or a family member, friend, or caregiver may be shown how to change the dressing. It is important to:  Wear gloves and protective clothing while changing a dressing. This may include eye protection.  Never let anyone change your dressing if he or she has an infection, skin condition, or skin wound or cut of any size.  Preparing to change your dressing  If needed, take pain medicine 30 minutes before the dressing change as prescribed by your health care provider.  Set up a clean station for wound care. You will need: ? A disposable garbage bag that is open and ready to use. ? Hand sanitizer. ? Wound cleanser or saltwater solution (saline) as told by your health care provider. ? New dressing material or bandages. Make sure to open the dressing  package so that the dressing remains on the inside of the package. You may also need the following in your clean station:  A box of vinyl gloves.  Tape.  Skin protectant. This may be a wipe, film, or spray.  Clean or germ-free (sterile) scissors.  Wound liner.  Cotton tip applicators.  Removing your old dressing  Wash your hands with soap and water. Dry your hands with a clean towel. If soap and water are not available, use hand sanitizer.  Put on gloves.  Turn off the pump and disconnect the tubing from the dressing.  Carefully remove the adhesive cover dressing in the direction of your hair growth. Only touch the outside edges of the dressing.  Remove the dressing that is inside the wound. If the dressing sticks, use a wound cleanser or saline solution to wet the dressing. This helps it come off more easily.  Throw the old dressing supplies into the ready garbage bag.  Remove your gloves by grabbing the cuff and turning the glove inside out. Place the gloves in the trash immediately.  Wash your hands with soap and water. Dry your hands with a clean towel. If soap and water are not available, use hand sanitizer. Cleaning your wound  Follow your health care provider's instructions on how to clean your wound. This may include using a saline or recommended wound cleanser.  Do not use over-the-counter medicated or antiseptic creams, sprays, liquids, or dressings unless told to do so by your health care  provider.  Clean the area thoroughly with the recommended saline solution or wound cleanser and a clean gauze pad.  Throw the gauze pad into the garbage bag.  Wash your hands with soap and water. Dry your hands with a clean towel. If soap and water are not available, use hand sanitizer. Applying the dressing  Apply a skin protectant to any skin that will be exposed to adhesive. Let the skin protectant dry.  Put a new dressing into the wound.  Apply a new cover dressing and  tube.  Take off your gloves. Put them in the plastic bag with the old dressing. Tie the bag shut and throw it away.  Wash your hands with soap and water. Dry your hands with a clean towel. If soap and water are not available, use hand sanitizer.  Attach the suction and turn the pump back on. Do not change the settings on the machine without talking to a health care provider.  Replace the container in the pump that collects fluid if it is full. Do this at least once a week. Contact a health care provider if:  You have new pain.  You develop irritation, a rash, or itching around the wound or dressing.  You see new black or yellow tissue in your wound.  The dressing changes are painful or cause bleeding.  The pump has been off for more than two hours and you do not know how to change the dressing.  The alarm for the pump goes off and you do not know what to do. Get help right away if:  You have a lot of bleeding.  You see a sudden change in the color or texture of the drainage.  The wound breaks open.  You have severe pain.  You have signs of infection, such as: ? More redness, swelling, or pain. ? More fluid or blood. ? Warmth. ? Pus or a bad smell. ? Red streaks leading from wound. ? A fever. This information is not intended to replace advice given to you by your health care provider. Make sure you discuss any questions you have with your health care provider. Document Released: 07/08/2011 Document Revised: 05/11/2015 Document Reviewed: 01/19/2015 Elsevier Interactive Patient Education  [employer].

## 2018-01-16 NOTE — Transfer of Care (Signed)
Immediate Anesthesia Transfer of Care Note  Patient: Jeffrey Wilkinson  Procedure(s) Performed: EXCISION OF 5CM BACK CYST (N/A Back)  Patient Location: PACU  Anesthesia Type:General  Level of Consciousness: awake, alert  and oriented  Airway & Oxygen Therapy: Patient Spontanous Breathing and Patient connected to face mask oxygen  Post-op Assessment: Report given to RN and Post -op Vital signs reviewed and stable  Post vital signs: Reviewed and stable  Last Vitals:  Vitals Value Taken Time  BP    Temp    Pulse    Resp    SpO2      Last Pain:  Vitals:   01/16/18 0605  TempSrc: Oral  PainSc: 0-No pain         Complications: No apparent anesthesia complications

## 2018-01-16 NOTE — Care Management Note (Signed)
Case Management Note  Patient Details  Name: Jeffrey Wilkinson MRN: [id number] Date of Birth: 1945-10-26  Subjective/Objective:                  Wound care post op  Action/Plan: [facility] notified of need of wound vac s/p or and is scheduled to go home post recovery at 0950  Expected Discharge Date:  01/16/18               Expected Discharge Plan:     In-House Referral:     Discharge planning Services     Post Acute Care Choice:    Choice offered to:     DME Arranged:    DME Agency:     HH Arranged:    [place] Agency:     Status of Service:     If discussed at [employer], dates discussed:    Additional Comments:  Leeroy Cha, RN 01/16/2018, 9:49 AM

## 2018-01-16 NOTE — Interval H&P Note (Signed)
History and Physical Interval Note:  01/16/2018 7:17 AM  Jeffrey Wilkinson  has presented today for surgery, with the diagnosis of PARTIALLY EXCISED 5CM BACK CYST  The various methods of treatment have been discussed with the patient and family. After consideration of risks, benefits and other options for treatment, the patient has consented to  Procedure(s): EXCISION OF 5CM BACK CYST (N/A) as a surgical intervention .  The patient's history has been reviewed, patient examined, no change in status, stable for surgery.  I have reviewed the patient's chart and labs.  Questions were answered to the patient's satisfaction.     [name]

## 2018-01-16 NOTE — Op Note (Addendum)
[name]  April 12, 1946 20 Sept 2019    PCP:  Leighton Ruff, MD   Surgeon: Kaylyn Lim, MD, FACS  Asst:  none  Anes:  general  Preop Dx: Incompletely excised cyst of the left lower back Postop Dx: Suspicious mass for sarcoma  Procedure: Wide excision of the mass of the back Location Surgery: [facility] 4 Complications: none  EBL:   20 cc  Drains: Wound VAC  Description of Procedure:  The patient was taken to OR 4 .  After anesthesia was administered and the patient was prepped  with [employer] and a timeout was performed.  Initially I started where Dr. Denna Haggard had ended and began teasing around this cyst.  He had excised a large ellipse of skin and there remained fat and old dried tissues over this.  Initially I was counted teasing around and it was separating from the surrounding tissue easily and looked initially like it might be a just a large sebaceous cyst but then I saw some more gray material within it that look suspicious for a sarcoma.  At that point I stopped and went out wider to excise and a larger ellipse which I photographed for the chart after I had excised the mass.  It is wider ellipse was taken down to the muscle and then excised all with the Bovie electrocautery.      The specimen was marked with orientation sutures for pathology and sent for permanent sections. The wound was 3 cm deep and ~8 x 4 cm ellipse.    Wound was irrigated and Bovie was used for controlling of oozing.  Wound VAC was placed to suction.  The patient was awakened from anesthesia and taken to recovery room in satisfactory condition.  The patient tolerated the procedure well and was taken to the PACU in stable condition.     Matt B. Hassell Done, [place], [facility], [place]

## 2018-01-16 NOTE — Care Management Note (Signed)
Case Management Note  Patient Details  Name: KENTRELL HALLAHAN MRN: [id number] Date of Birth: July 11, 1945  Subjective/Objective:      Received a call from PACU regarding the need for a wound vac. Patient to be discharged home today. Contacted [facility], provided with auth form, Dr. Hassell Done signed and [facility] processed for auth.               Action/Plan: Received a call from [facility], patient Josem Kaufmann has processed. Insurance provider requires a monthly copay. Equipment will be delivered to PACU and place on patient by [facility] rep. [facility] will follow for vac changes as well.   Expected Discharge Date:  01/16/18               Expected Discharge Plan:  [place]  In-House Referral:  NA  Discharge planning Services  CM Consult  Post Acute Care Choice:  Durable Medical Equipment, Home Health Choice offered to:  Patient  DME Arranged:  Vac DME Agency:  [place] Arranged:  RN [facility] Agency:  [place]  Status of Service:  Completed, signed off  If discussed at [place] of Stay Meetings, dates discussed:    Additional Comments:  Guadalupe Maple, RN 01/16/2018, 11:23 AM

## 2018-01-16 NOTE — Anesthesia Procedure Notes (Signed)
Procedure Name: Intubation Date/Time: 01/16/2018 7:35 AM Performed by: Kiah Vanalstine D, CRNA Pre-anesthesia Checklist: Patient identified, Emergency Drugs available, Suction available and Patient being monitored Patient Re-evaluated:Patient Re-evaluated prior to induction Oxygen Delivery Method: Circle system utilized Preoxygenation: Pre-oxygenation with 100% oxygen Induction Type: IV induction Ventilation: Mask ventilation without difficulty Laryngoscope Size: Mac and 4 Grade View: Grade III Tube type: Oral Tube size: 7.5 mm Number of attempts: 1 Airway Equipment and Method: Stylet,  Oral airway and Bougie stylet Placement Confirmation: ETT inserted through vocal cords under direct vision,  positive ETCO2 and breath sounds checked- equal and bilateral Secured at: 22 cm Tube secured with: Tape Dental Injury: Teeth and Oropharynx as per pre-operative assessment

## 2018-01-16 NOTE — Progress Notes (Signed)
PACU  Phase II  Spoke to Vinnie Level , care management RN, waiting on [facility] with new plan and different equipment/ wound vacuum  for patient before discharge home. Pt is stable and made aware about the delay for discharge, food and fluids offered to patient.

## 2018-01-16 NOTE — Anesthesia Postprocedure Evaluation (Signed)
Anesthesia Post Note  Patient: Jeffrey Wilkinson  Procedure(s) Performed: EXCISION OF 5CM BACK CYST (N/A Back)     Patient location during evaluation: PACU Anesthesia Type: General Level of consciousness: sedated Pain management: pain level controlled Vital Signs Assessment: post-procedure vital signs reviewed and stable Respiratory status: spontaneous breathing and respiratory function stable Cardiovascular status: stable Postop Assessment: no apparent nausea or vomiting Anesthetic complications: no    Last Vitals:  Vitals:   01/16/18 1000 01/16/18 1015  BP: 139/87 (!) 145/86  Pulse: 69 70  Resp: 18 13  Temp:  36.6 C  SpO2: 98% 98%    Last Pain:  Vitals:   01/16/18 0945  TempSrc:   PainSc: 0-No pain                 Ilayda Toda DANIEL

## 2018-01-17 ENCOUNTER — Encounter (HOSPITAL_COMMUNITY): Payer: Self-pay | Admitting: Surgery

## 2018-05-19 ENCOUNTER — Other Ambulatory Visit: Payer: Self-pay | Admitting: Internal Medicine

## 2018-05-19 DIAGNOSIS — I714 Abdominal aortic aneurysm, without rupture, unspecified: Secondary | ICD-10-CM

## 2018-05-22 ENCOUNTER — Other Ambulatory Visit: Payer: Medicare Other

## 2018-05-27 ENCOUNTER — Ambulatory Visit
Admission: RE | Admit: 2018-05-27 | Discharge: 2018-05-27 | Disposition: A | Discharge status: Home / Self Care | Payer: Medicare Other | Source: Ambulatory Visit | Attending: Internal Medicine | Admitting: Internal Medicine

## 2018-05-27 DIAGNOSIS — I714 Abdominal aortic aneurysm, without rupture, unspecified: Secondary | ICD-10-CM

## 2018-05-27 MED ORDER — IOPAMIDOL (ISOVUE-370) INJECTION 76%
75.0000 mL | Freq: Once | INTRAVENOUS | Status: AC | PRN
  Administered 2018-05-27: 75 mL via INTRAVENOUS

## 2020-02-15 ENCOUNTER — Ambulatory Visit: Payer: Medicare Other | Admitting: Dermatology

## 2020-02-15 ENCOUNTER — Encounter: Payer: Self-pay | Admitting: Dermatology

## 2020-02-15 ENCOUNTER — Other Ambulatory Visit: Payer: Self-pay

## 2020-02-15 DIAGNOSIS — Z1283 Encounter for screening for malignant neoplasm of skin: Secondary | ICD-10-CM | POA: Diagnosis not present

## 2020-02-15 NOTE — Patient Instructions (Addendum)
Routine follow-up for Jeffrey Wilkinson date of birth Dec 23, 1945.  At his last visit I felt there was a little thickening or cysts adjacent to the surgical scar for the cancer removed from his left scapula.  He saw Dr. Hassell Done who could not find any anomaly.  Today I both inspected his legs and all skin from the waist up, felt all regional lymph node basins, and palpated essentially all the skin on his back and I also could find no visible or palpable pathology.  There is nothing currently to biopsy and no indication for imaging studies.  Should Jeffrey Wilkinson or his partner noticed a change before 1 year, if there is no available appointment he can call me on off-hours and get my cell phone and contact me and I will try to see him sooner.

## 2020-02-17 NOTE — Progress Notes (Signed)
   Follow-Up Visit   Subjective  Jeffrey Wilkinson is a 74 y.o. male who presents for the following: Annual Exam (Patient here today for skin check.  No concerns per patient).  Annual skin exam Location:  Duration:  Quality:  Associated Signs/Symptoms: Modifying Factors:  Severity:  Timing: Context:   Objective  Well appearing patient in no apparent distress; mood and affect are within normal limits.  A full examination was performed including scalp, head, eyes, ears, nose, lips, neck, chest, axillae, abdomen, back, buttocks, bilateral upper extremities, bilateral lower extremities, hands, feet, fingers, toes, fingernails, and toenails. All findings within normal limits unless otherwise noted below.   Assessment & Plan    Routine follow-up for Jeffrey Wilkinson date of birth Dec 22, 1945.  At his last visit I felt there was a little thickening or cysts adjacent to the surgical scar for the cancer removed from his left scapula.  He saw Dr. Hassell Done who could not find any anomaly.  Today I both inspected his legs and all skin from the waist up, felt all regional lymph node basins, and palpated essentially all the skin on his back and I also could find no visible or palpable pathology.  There is nothing currently to biopsy and no indication for imaging studies.  Should Jeffrey Wilkinson or his partner noticed a change before 1 year, if there is no available appointment he can call me on off-hours and get my cell phone and contact me and I will try to see him sooner.    Encounter for screening for malignant neoplasm of skin Right Breast  Yearly skin check     I, Lavonna Monarch, MD, have reviewed all documentation for this visit.  The documentation on 03/25/20 for the exam, diagnosis, procedures, and orders are all accurate and complete.

## 2020-03-21 ENCOUNTER — Other Ambulatory Visit: Payer: Self-pay | Admitting: Family Medicine

## 2020-03-21 DIAGNOSIS — I714 Abdominal aortic aneurysm, without rupture, unspecified: Secondary | ICD-10-CM

## 2020-03-25 ENCOUNTER — Encounter: Payer: Self-pay | Admitting: Dermatology

## 2020-04-07 ENCOUNTER — Ambulatory Visit
Admission: RE | Admit: 2020-04-07 | Discharge: 2020-04-07 | Disposition: A | Discharge status: Home / Self Care | Payer: Medicare Other | Source: Ambulatory Visit | Attending: Family Medicine | Admitting: Family Medicine

## 2020-04-07 DIAGNOSIS — I714 Abdominal aortic aneurysm, without rupture, unspecified: Secondary | ICD-10-CM

## 2020-05-16 DIAGNOSIS — I447 Left bundle-branch block, unspecified: Secondary | ICD-10-CM | POA: Diagnosis not present

## 2020-05-16 DIAGNOSIS — G9340 Encephalopathy, unspecified: Secondary | ICD-10-CM | POA: Diagnosis not present

## 2020-05-16 DIAGNOSIS — I493 Ventricular premature depolarization: Secondary | ICD-10-CM | POA: Diagnosis not present

## 2020-05-16 DIAGNOSIS — I119 Hypertensive heart disease without heart failure: Secondary | ICD-10-CM | POA: Diagnosis not present

## 2020-05-16 DIAGNOSIS — B954 Other streptococcus as the cause of diseases classified elsewhere: Secondary | ICD-10-CM | POA: Diagnosis not present

## 2020-05-16 DIAGNOSIS — R0902 Hypoxemia: Secondary | ICD-10-CM | POA: Diagnosis not present

## 2020-05-16 DIAGNOSIS — I459 Conduction disorder, unspecified: Secondary | ICD-10-CM | POA: Diagnosis not present

## 2020-05-16 DIAGNOSIS — D72829 Elevated white blood cell count, unspecified: Secondary | ICD-10-CM | POA: Diagnosis not present

## 2020-05-16 DIAGNOSIS — R569 Unspecified convulsions: Secondary | ICD-10-CM | POA: Diagnosis not present

## 2020-05-16 DIAGNOSIS — R001 Bradycardia, unspecified: Secondary | ICD-10-CM | POA: Diagnosis not present

## 2020-05-16 DIAGNOSIS — J9600 Acute respiratory failure, unspecified whether with hypoxia or hypercapnia: Secondary | ICD-10-CM | POA: Diagnosis not present

## 2020-05-16 DIAGNOSIS — J9690 Respiratory failure, unspecified, unspecified whether with hypoxia or hypercapnia: Secondary | ICD-10-CM | POA: Diagnosis not present

## 2020-05-16 DIAGNOSIS — D696 Thrombocytopenia, unspecified: Secondary | ICD-10-CM | POA: Diagnosis not present

## 2020-05-16 DIAGNOSIS — G939 Disorder of brain, unspecified: Secondary | ICD-10-CM | POA: Diagnosis not present

## 2020-05-16 DIAGNOSIS — I9589 Other hypotension: Secondary | ICD-10-CM | POA: Diagnosis not present

## 2020-05-16 DIAGNOSIS — W1839XA Other fall on same level, initial encounter: Secondary | ICD-10-CM | POA: Diagnosis not present

## 2020-05-16 DIAGNOSIS — Z20822 Contact with and (suspected) exposure to covid-19: Secondary | ICD-10-CM | POA: Diagnosis not present

## 2020-05-16 DIAGNOSIS — I10 Essential (primary) hypertension: Secondary | ICD-10-CM | POA: Diagnosis not present

## 2020-05-16 DIAGNOSIS — J9811 Atelectasis: Secondary | ICD-10-CM | POA: Diagnosis not present

## 2020-05-16 DIAGNOSIS — I469 Cardiac arrest, cause unspecified: Secondary | ICD-10-CM | POA: Diagnosis not present

## 2020-05-16 DIAGNOSIS — I110 Hypertensive heart disease with heart failure: Secondary | ICD-10-CM | POA: Diagnosis not present

## 2020-05-16 DIAGNOSIS — Z4682 Encounter for fitting and adjustment of non-vascular catheter: Secondary | ICD-10-CM | POA: Diagnosis not present

## 2020-05-16 DIAGNOSIS — I213 ST elevation (STEMI) myocardial infarction of unspecified site: Secondary | ICD-10-CM | POA: Diagnosis not present

## 2020-05-16 DIAGNOSIS — R55 Syncope and collapse: Secondary | ICD-10-CM | POA: Diagnosis not present

## 2020-05-16 DIAGNOSIS — J9601 Acute respiratory failure with hypoxia: Secondary | ICD-10-CM | POA: Diagnosis not present

## 2020-05-16 DIAGNOSIS — Z9981 Dependence on supplemental oxygen: Secondary | ICD-10-CM | POA: Diagnosis not present

## 2020-05-16 DIAGNOSIS — Z66 Do not resuscitate: Secondary | ICD-10-CM | POA: Diagnosis not present

## 2020-05-16 DIAGNOSIS — G931 Anoxic brain damage, not elsewhere classified: Secondary | ICD-10-CM | POA: Diagnosis not present

## 2020-05-16 DIAGNOSIS — Z9911 Dependence on respirator [ventilator] status: Secondary | ICD-10-CM | POA: Diagnosis not present

## 2020-05-16 DIAGNOSIS — I259 Chronic ischemic heart disease, unspecified: Secondary | ICD-10-CM | POA: Diagnosis not present

## 2020-05-16 DIAGNOSIS — I5020 Unspecified systolic (congestive) heart failure: Secondary | ICD-10-CM | POA: Diagnosis not present

## 2020-05-16 DIAGNOSIS — I6782 Cerebral ischemia: Secondary | ICD-10-CM | POA: Diagnosis not present

## 2020-05-16 DIAGNOSIS — I618 Other nontraumatic intracerebral hemorrhage: Secondary | ICD-10-CM | POA: Diagnosis not present

## 2020-05-16 DIAGNOSIS — S0083XA Contusion of other part of head, initial encounter: Secondary | ICD-10-CM | POA: Diagnosis not present

## 2020-05-16 DIAGNOSIS — I4901 Ventricular fibrillation: Secondary | ICD-10-CM | POA: Diagnosis not present

## 2020-05-16 DIAGNOSIS — Z515 Encounter for palliative care: Secondary | ICD-10-CM | POA: Diagnosis not present

## 2020-05-16 DIAGNOSIS — G9389 Other specified disorders of brain: Secondary | ICD-10-CM | POA: Diagnosis not present

## 2020-05-16 DIAGNOSIS — M2578 Osteophyte, vertebrae: Secondary | ICD-10-CM | POA: Diagnosis not present

## 2020-05-16 DIAGNOSIS — I462 Cardiac arrest due to underlying cardiac condition: Secondary | ICD-10-CM | POA: Diagnosis not present

## 2020-05-16 DIAGNOSIS — M47812 Spondylosis without myelopathy or radiculopathy, cervical region: Secondary | ICD-10-CM | POA: Diagnosis not present

## 2020-05-16 DIAGNOSIS — Z781 Physical restraint status: Secondary | ICD-10-CM | POA: Diagnosis not present

## 2020-05-16 DIAGNOSIS — G319 Degenerative disease of nervous system, unspecified: Secondary | ICD-10-CM | POA: Diagnosis not present

## 2020-05-16 DIAGNOSIS — J3489 Other specified disorders of nose and nasal sinuses: Secondary | ICD-10-CM | POA: Diagnosis not present

## 2020-05-16 DIAGNOSIS — Y998 Other external cause status: Secondary | ICD-10-CM | POA: Diagnosis not present

## 2020-05-16 DIAGNOSIS — I472 Ventricular tachycardia: Secondary | ICD-10-CM | POA: Diagnosis not present

## 2020-05-16 DIAGNOSIS — E870 Hyperosmolality and hypernatremia: Secondary | ICD-10-CM | POA: Diagnosis not present

## 2020-05-16 DIAGNOSIS — I440 Atrioventricular block, first degree: Secondary | ICD-10-CM | POA: Diagnosis not present

## 2020-05-16 DIAGNOSIS — I4891 Unspecified atrial fibrillation: Secondary | ICD-10-CM | POA: Diagnosis not present

## 2020-05-16 DIAGNOSIS — Z87891 Personal history of nicotine dependence: Secondary | ICD-10-CM | POA: Diagnosis not present

## 2020-05-16 DIAGNOSIS — E7220 Disorder of urea cycle metabolism, unspecified: Secondary | ICD-10-CM | POA: Diagnosis not present

## 2020-05-16 DIAGNOSIS — T426X5A Adverse effect of other antiepileptic and sedative-hypnotic drugs, initial encounter: Secondary | ICD-10-CM | POA: Diagnosis not present

## 2020-05-16 DIAGNOSIS — I2510 Atherosclerotic heart disease of native coronary artery without angina pectoris: Secondary | ICD-10-CM | POA: Diagnosis not present

## 2020-05-17 DIAGNOSIS — I447 Left bundle-branch block, unspecified: Secondary | ICD-10-CM | POA: Diagnosis not present

## 2020-05-17 DIAGNOSIS — G9340 Encephalopathy, unspecified: Secondary | ICD-10-CM | POA: Diagnosis not present

## 2020-05-17 DIAGNOSIS — I2510 Atherosclerotic heart disease of native coronary artery without angina pectoris: Secondary | ICD-10-CM | POA: Diagnosis not present

## 2020-05-17 DIAGNOSIS — I259 Chronic ischemic heart disease, unspecified: Secondary | ICD-10-CM | POA: Diagnosis not present

## 2020-05-17 DIAGNOSIS — I469 Cardiac arrest, cause unspecified: Secondary | ICD-10-CM | POA: Diagnosis not present

## 2020-05-17 DIAGNOSIS — I440 Atrioventricular block, first degree: Secondary | ICD-10-CM | POA: Diagnosis not present

## 2020-05-17 DIAGNOSIS — Z87891 Personal history of nicotine dependence: Secondary | ICD-10-CM | POA: Diagnosis not present

## 2020-05-17 DIAGNOSIS — I493 Ventricular premature depolarization: Secondary | ICD-10-CM | POA: Diagnosis not present

## 2020-05-17 DIAGNOSIS — I119 Hypertensive heart disease without heart failure: Secondary | ICD-10-CM | POA: Diagnosis not present

## 2020-05-17 DIAGNOSIS — R569 Unspecified convulsions: Secondary | ICD-10-CM | POA: Diagnosis not present

## 2020-05-17 DIAGNOSIS — I4901 Ventricular fibrillation: Secondary | ICD-10-CM | POA: Diagnosis not present

## 2020-05-18 DIAGNOSIS — Z87891 Personal history of nicotine dependence: Secondary | ICD-10-CM | POA: Diagnosis not present

## 2020-05-18 DIAGNOSIS — I469 Cardiac arrest, cause unspecified: Secondary | ICD-10-CM | POA: Diagnosis not present

## 2020-05-18 DIAGNOSIS — I110 Hypertensive heart disease with heart failure: Secondary | ICD-10-CM | POA: Diagnosis not present

## 2020-05-18 DIAGNOSIS — I5020 Unspecified systolic (congestive) heart failure: Secondary | ICD-10-CM | POA: Diagnosis not present

## 2020-05-18 DIAGNOSIS — I2510 Atherosclerotic heart disease of native coronary artery without angina pectoris: Secondary | ICD-10-CM | POA: Diagnosis not present

## 2020-05-19 DIAGNOSIS — J9601 Acute respiratory failure with hypoxia: Secondary | ICD-10-CM | POA: Diagnosis not present

## 2020-05-19 DIAGNOSIS — I2510 Atherosclerotic heart disease of native coronary artery without angina pectoris: Secondary | ICD-10-CM | POA: Diagnosis not present

## 2020-05-19 DIAGNOSIS — I469 Cardiac arrest, cause unspecified: Secondary | ICD-10-CM | POA: Diagnosis not present

## 2020-05-19 DIAGNOSIS — G9389 Other specified disorders of brain: Secondary | ICD-10-CM | POA: Diagnosis not present

## 2020-05-19 DIAGNOSIS — I5020 Unspecified systolic (congestive) heart failure: Secondary | ICD-10-CM | POA: Diagnosis not present

## 2020-05-19 DIAGNOSIS — G319 Degenerative disease of nervous system, unspecified: Secondary | ICD-10-CM | POA: Diagnosis not present

## 2020-05-19 DIAGNOSIS — Z87891 Personal history of nicotine dependence: Secondary | ICD-10-CM | POA: Diagnosis not present

## 2020-05-19 DIAGNOSIS — I110 Hypertensive heart disease with heart failure: Secondary | ICD-10-CM | POA: Diagnosis not present

## 2020-05-20 DIAGNOSIS — J9601 Acute respiratory failure with hypoxia: Secondary | ICD-10-CM | POA: Diagnosis not present

## 2020-05-20 DIAGNOSIS — I2510 Atherosclerotic heart disease of native coronary artery without angina pectoris: Secondary | ICD-10-CM | POA: Diagnosis not present

## 2020-05-20 DIAGNOSIS — I469 Cardiac arrest, cause unspecified: Secondary | ICD-10-CM | POA: Diagnosis not present

## 2020-05-20 DIAGNOSIS — Z9911 Dependence on respirator [ventilator] status: Secondary | ICD-10-CM | POA: Diagnosis not present

## 2020-05-20 DIAGNOSIS — Z87891 Personal history of nicotine dependence: Secondary | ICD-10-CM | POA: Diagnosis not present

## 2020-05-20 DIAGNOSIS — I110 Hypertensive heart disease with heart failure: Secondary | ICD-10-CM | POA: Diagnosis not present

## 2020-05-20 DIAGNOSIS — I5020 Unspecified systolic (congestive) heart failure: Secondary | ICD-10-CM | POA: Diagnosis not present

## 2020-05-21 DIAGNOSIS — I2510 Atherosclerotic heart disease of native coronary artery without angina pectoris: Secondary | ICD-10-CM | POA: Diagnosis not present

## 2020-05-21 DIAGNOSIS — I469 Cardiac arrest, cause unspecified: Secondary | ICD-10-CM | POA: Diagnosis not present

## 2020-05-21 DIAGNOSIS — J9601 Acute respiratory failure with hypoxia: Secondary | ICD-10-CM | POA: Diagnosis not present

## 2020-05-21 DIAGNOSIS — Z9911 Dependence on respirator [ventilator] status: Secondary | ICD-10-CM | POA: Diagnosis not present

## 2020-05-23 DIAGNOSIS — I469 Cardiac arrest, cause unspecified: Secondary | ICD-10-CM | POA: Diagnosis not present

## 2020-05-23 DIAGNOSIS — I2510 Atherosclerotic heart disease of native coronary artery without angina pectoris: Secondary | ICD-10-CM | POA: Diagnosis not present

## 2020-05-23 DIAGNOSIS — J9601 Acute respiratory failure with hypoxia: Secondary | ICD-10-CM | POA: Diagnosis not present

## 2020-05-23 DIAGNOSIS — I4901 Ventricular fibrillation: Secondary | ICD-10-CM | POA: Diagnosis not present

## 2020-05-24 DIAGNOSIS — I469 Cardiac arrest, cause unspecified: Secondary | ICD-10-CM | POA: Diagnosis not present

## 2020-05-24 DIAGNOSIS — R0902 Hypoxemia: Secondary | ICD-10-CM | POA: Diagnosis not present

## 2020-05-24 DIAGNOSIS — Z9911 Dependence on respirator [ventilator] status: Secondary | ICD-10-CM | POA: Diagnosis not present

## 2020-05-24 DIAGNOSIS — I4901 Ventricular fibrillation: Secondary | ICD-10-CM | POA: Diagnosis not present

## 2020-05-24 DIAGNOSIS — J9601 Acute respiratory failure with hypoxia: Secondary | ICD-10-CM | POA: Diagnosis not present

## 2020-05-25 DIAGNOSIS — I4901 Ventricular fibrillation: Secondary | ICD-10-CM | POA: Diagnosis not present

## 2020-05-25 DIAGNOSIS — I469 Cardiac arrest, cause unspecified: Secondary | ICD-10-CM | POA: Diagnosis not present

## 2020-05-25 DIAGNOSIS — J9601 Acute respiratory failure with hypoxia: Secondary | ICD-10-CM | POA: Diagnosis not present

## 2020-05-25 DIAGNOSIS — Z9911 Dependence on respirator [ventilator] status: Secondary | ICD-10-CM | POA: Diagnosis not present

## 2020-05-26 DIAGNOSIS — I4901 Ventricular fibrillation: Secondary | ICD-10-CM | POA: Diagnosis not present

## 2020-05-26 DIAGNOSIS — J9601 Acute respiratory failure with hypoxia: Secondary | ICD-10-CM | POA: Diagnosis not present

## 2020-05-26 DIAGNOSIS — Z9911 Dependence on respirator [ventilator] status: Secondary | ICD-10-CM | POA: Diagnosis not present

## 2020-05-26 DIAGNOSIS — I469 Cardiac arrest, cause unspecified: Secondary | ICD-10-CM | POA: Diagnosis not present

## 2020-06-27 DEATH — deceased

## 2021-02-14 ENCOUNTER — Ambulatory Visit: Payer: Medicare Other | Admitting: Dermatology

## 2022-01-11 IMAGING — US US AORTA
1 series · 14 of 25 positions shown · non-contrast
Comparison: CTA 05/27/2018

CLINICAL DATA: Atheromatous aorta, family history of aneurysm

EXAM:
ULTRASOUND OF ABDOMINAL AORTA
TECHNIQUE: Ultrasound examination of the abdominal aorta and proximal common
iliac arteries was performed to evaluate for aneurysm. Additional
color and Doppler images of the distal aorta were obtained to
document patency.

[Series 1: us aorta · 0.25mm/px · 14 of 33 slices shown]
[im 1/33]
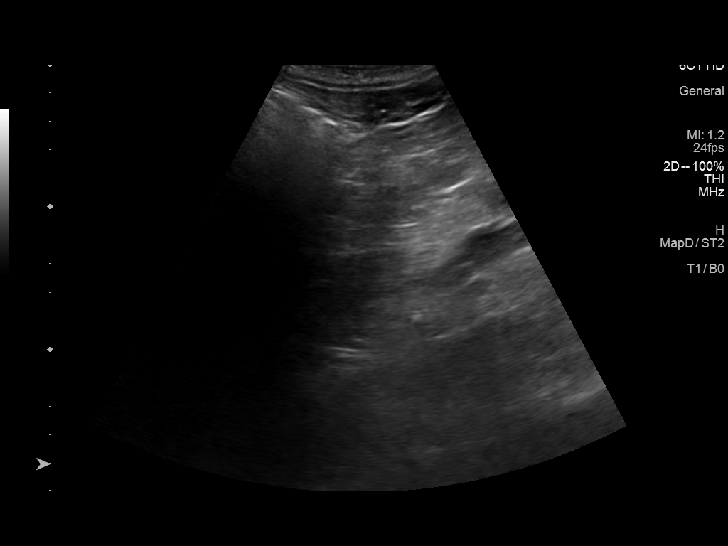
[im 3/33]
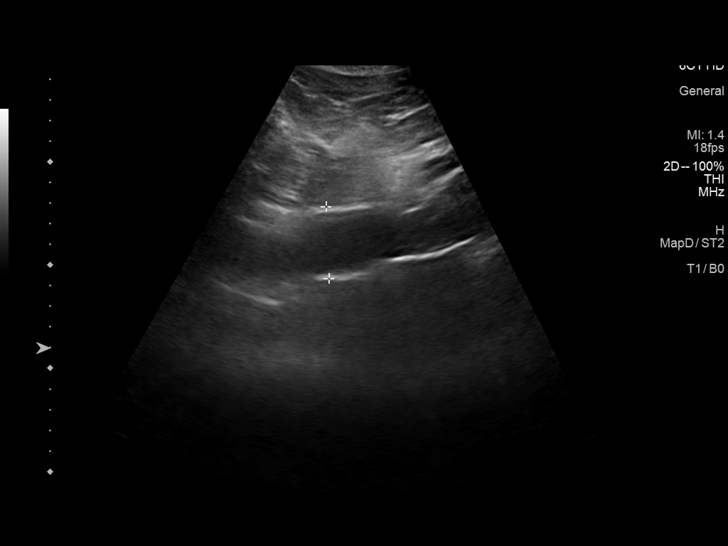
[im 6/33]
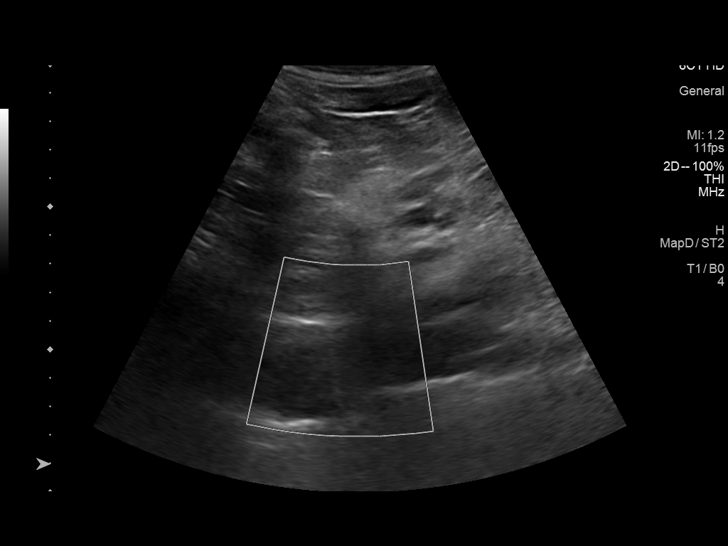
[im 9/33]
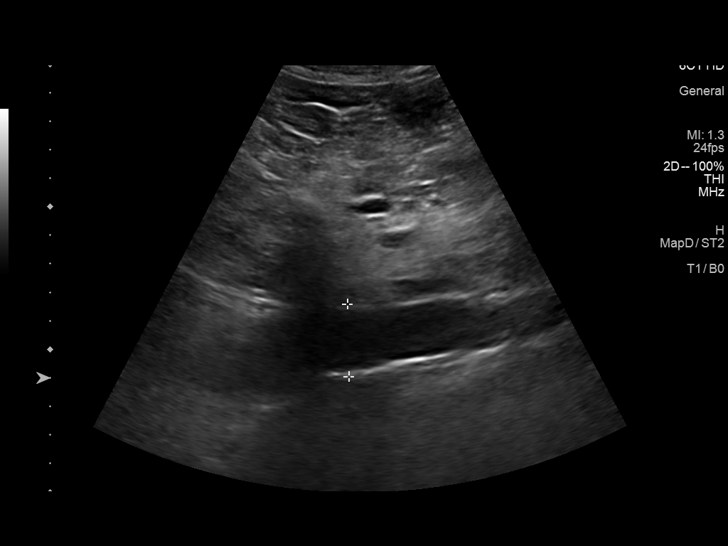
[im 11/33]
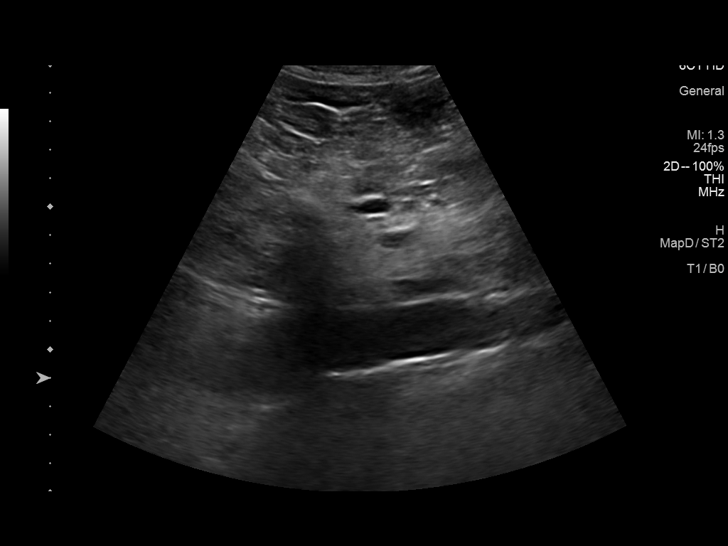
[im 13/33]
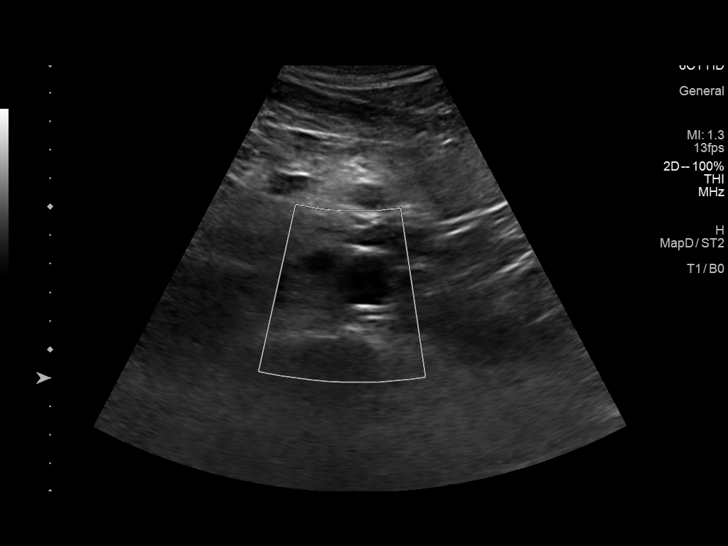
[im 15/33]
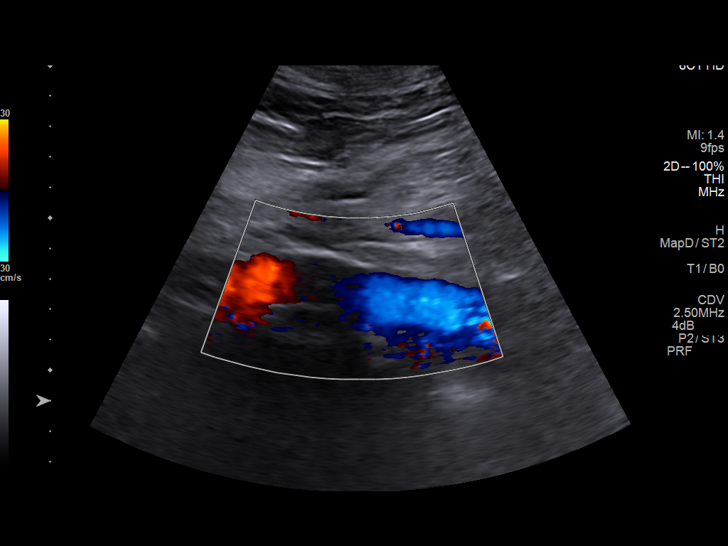
[im 18/33]
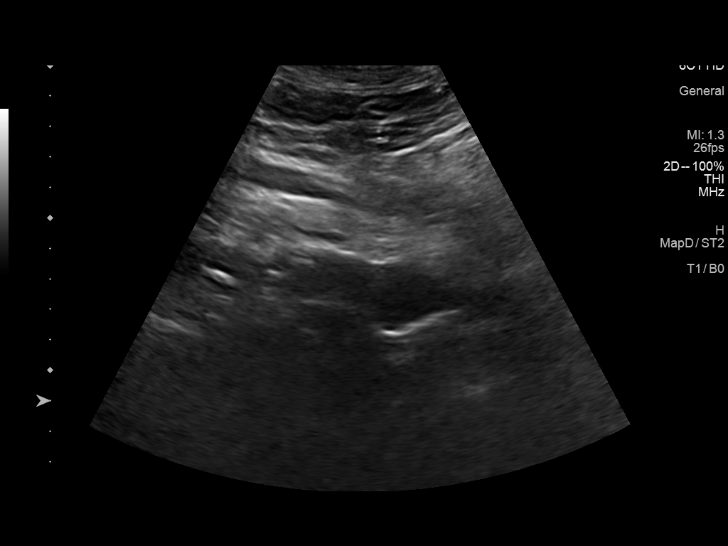
[im 21/33]
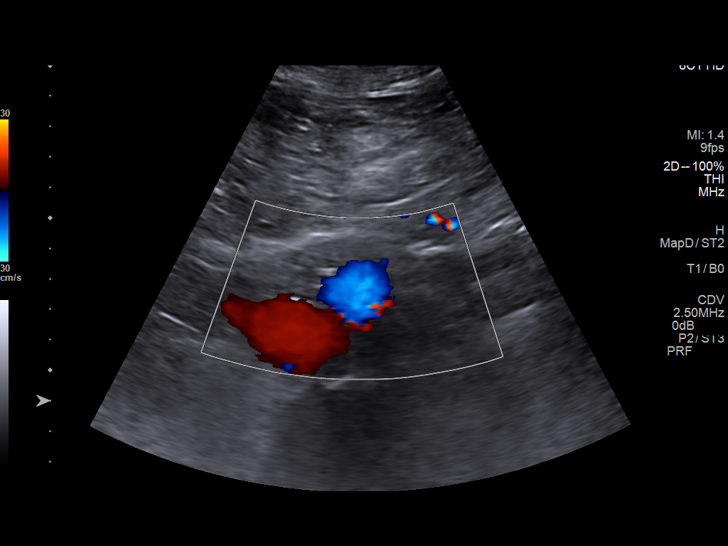
[im 22/33]
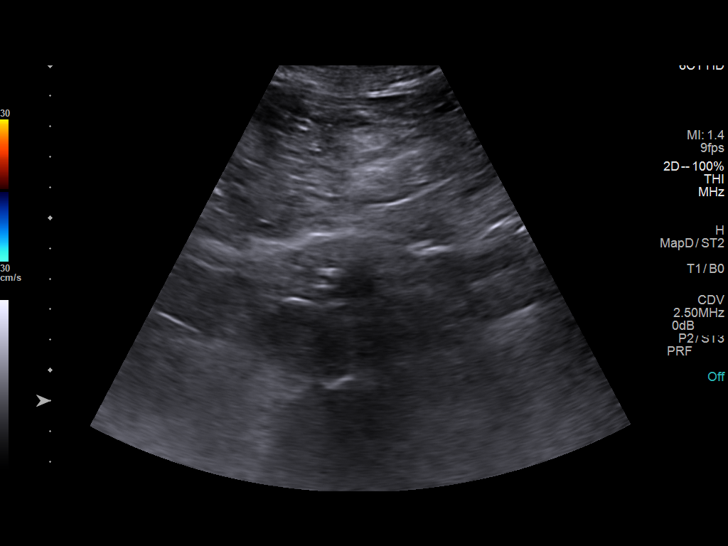
[im 25/33]
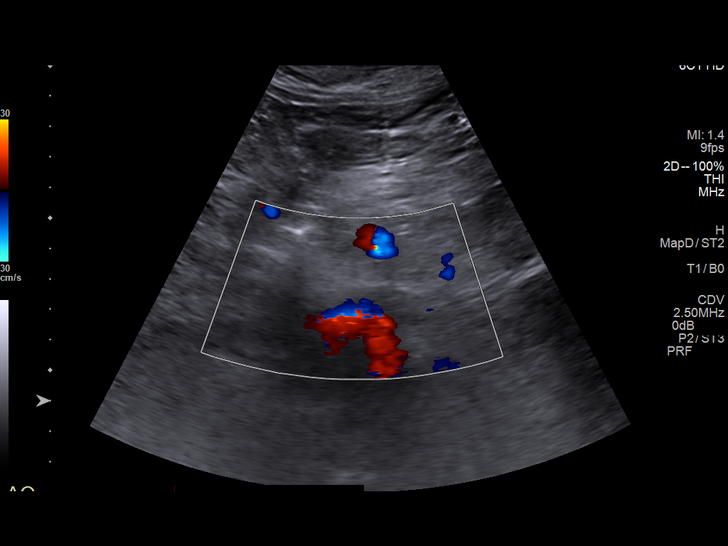
[im 27/33]
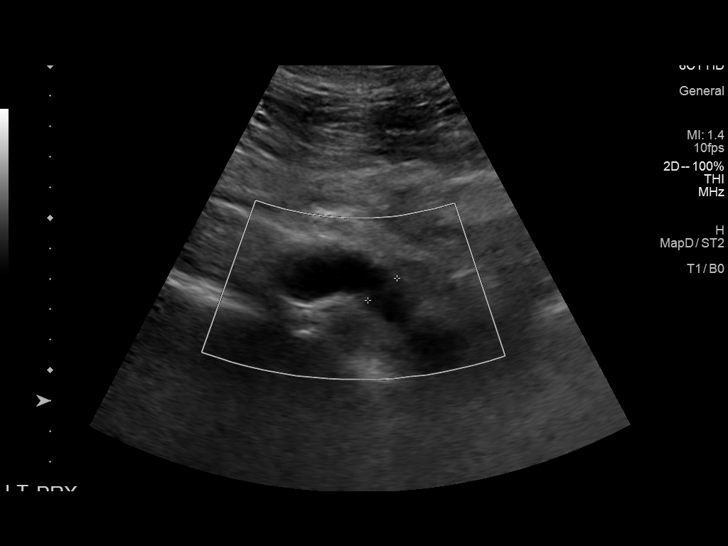
[im 30/33]
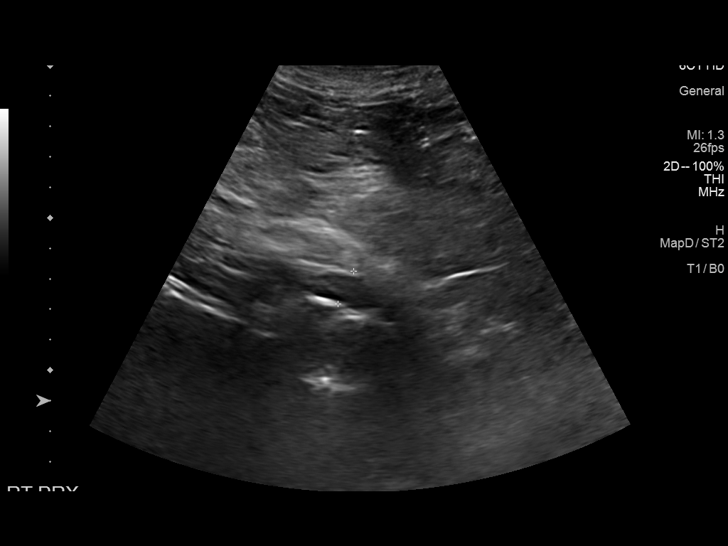
[im 33/33]
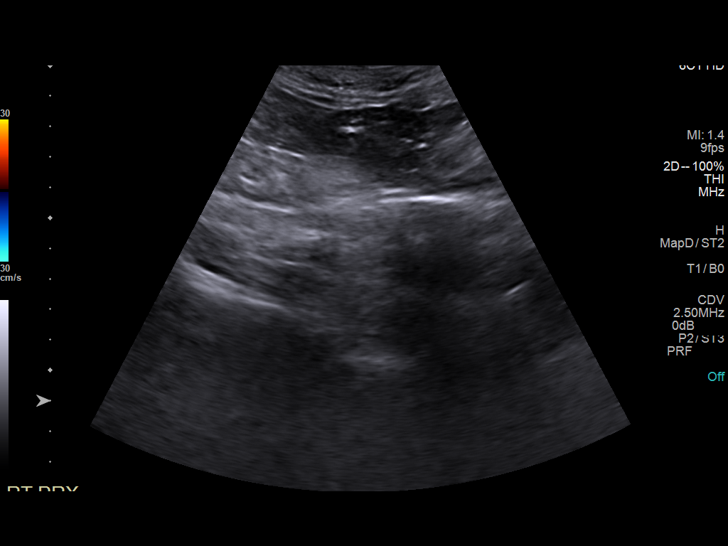

[14 of 25 positions shown; findings below may reference images not displayed]

FINDINGS: Abdominal aortic measurements as follows:

Proximal:  3.5 x 3.1 cm

Mid:  2.5 x 2.2 cm

Distal:  2.6 x 2.2 cm
Patent: Yes, peak systolic velocity is 78 cm/s

Right common iliac artery: 1.2 x 1.3 cm

Left common iliac artery: 1.2 x 1.1 cm
IMPRESSION: Abdominal aorta nonaneurysmal.
